# Patient Record
Sex: Male | Born: 1955 | Race: White | Hispanic: No | Marital: Married | Smoking: Former smoker
Health system: Southern US, Community
[De-identification: ages and names within clinical notes are randomized; demographics above are authoritative.]

## PROBLEM LIST (undated history)

## (undated) DIAGNOSIS — Z87891 Personal history of nicotine dependence: Secondary | ICD-10-CM

## (undated) DIAGNOSIS — F419 Anxiety disorder, unspecified: Secondary | ICD-10-CM

## (undated) DIAGNOSIS — E119 Type 2 diabetes mellitus without complications: Secondary | ICD-10-CM

## (undated) DIAGNOSIS — I10 Essential (primary) hypertension: Secondary | ICD-10-CM

## (undated) DIAGNOSIS — D751 Secondary polycythemia: Secondary | ICD-10-CM

## (undated) DIAGNOSIS — D72829 Elevated white blood cell count, unspecified: Secondary | ICD-10-CM

## (undated) DIAGNOSIS — I2510 Atherosclerotic heart disease of native coronary artery without angina pectoris: Secondary | ICD-10-CM

## (undated) DIAGNOSIS — E785 Hyperlipidemia, unspecified: Secondary | ICD-10-CM

## (undated) HISTORY — PX: OTHER SURGICAL HISTORY: SHX169

## (undated) HISTORY — DX: Anxiety disorder, unspecified: F41.9

## (undated) HISTORY — DX: Secondary polycythemia: D75.1

## (undated) HISTORY — DX: Personal history of nicotine dependence: Z87.891

---

## 1998-02-16 ENCOUNTER — Emergency Department (HOSPITAL_COMMUNITY): Admission: EM | Admit: 1998-02-16 | Discharge: 1998-02-16 | Payer: Self-pay | Admitting: Endocrinology

## 2001-06-30 ENCOUNTER — Encounter: Payer: Self-pay | Admitting: Emergency Medicine

## 2001-06-30 ENCOUNTER — Emergency Department (HOSPITAL_COMMUNITY): Admission: EM | Admit: 2001-06-30 | Discharge: 2001-06-30 | Payer: Self-pay | Admitting: *Deleted

## 2001-07-09 ENCOUNTER — Encounter: Payer: Self-pay | Admitting: Orthopedic Surgery

## 2001-07-09 ENCOUNTER — Encounter: Admission: RE | Admit: 2001-07-09 | Discharge: 2001-07-09 | Payer: Self-pay | Admitting: Orthopedic Surgery

## 2007-10-07 ENCOUNTER — Encounter: Admission: RE | Admit: 2007-10-07 | Discharge: 2007-10-07 | Payer: Self-pay | Admitting: Orthopaedic Surgery

## 2012-06-05 ENCOUNTER — Emergency Department (HOSPITAL_COMMUNITY)
Admission: EM | Admit: 2012-06-05 | Discharge: 2012-06-05 | Disposition: A | Discharge status: Home / Self Care | Payer: PRIVATE HEALTH INSURANCE | Attending: Emergency Medicine | Admitting: Emergency Medicine

## 2012-06-05 ENCOUNTER — Encounter (HOSPITAL_COMMUNITY): Payer: Self-pay | Admitting: *Deleted

## 2012-06-05 DIAGNOSIS — F17200 Nicotine dependence, unspecified, uncomplicated: Secondary | ICD-10-CM | POA: Insufficient documentation

## 2012-06-05 DIAGNOSIS — N200 Calculus of kidney: Secondary | ICD-10-CM | POA: Insufficient documentation

## 2012-06-05 LAB — CBC WITH DIFFERENTIAL/PLATELET
Basophils Absolute: 0 10*3/uL (ref 0.0–0.1)
Basophils Relative: 0 % (ref 0–1)
Eosinophils Absolute: 0.2 10*3/uL (ref 0.0–0.7)
Hemoglobin: 17.7 g/dL — ABNORMAL HIGH (ref 13.0–17.0)
MCHC: 35 g/dL (ref 30.0–36.0)
Neutro Abs: 8.3 10*3/uL — ABNORMAL HIGH (ref 1.7–7.7)
Neutrophils Relative %: 61 % (ref 43–77)
Platelets: 317 10*3/uL (ref 150–400)
RDW: 12.9 % (ref 11.5–15.5)

## 2012-06-05 LAB — COMPREHENSIVE METABOLIC PANEL
AST: 17 U/L (ref 0–37)
Albumin: 4.3 g/dL (ref 3.5–5.2)
Alkaline Phosphatase: 68 U/L (ref 39–117)
Chloride: 98 mEq/L (ref 96–112)
Potassium: 5.7 mEq/L — ABNORMAL HIGH (ref 3.5–5.1)
Sodium: 134 mEq/L — ABNORMAL LOW (ref 135–145)
Total Bilirubin: 0.3 mg/dL (ref 0.3–1.2)
Total Protein: 7.8 g/dL (ref 6.0–8.3)

## 2012-06-05 LAB — URINALYSIS, ROUTINE W REFLEX MICROSCOPIC
Bilirubin Urine: NEGATIVE
Glucose, UA: 500 mg/dL — AB
Ketones, ur: 15 mg/dL — AB
Leukocytes, UA: NEGATIVE
Nitrite: NEGATIVE
Specific Gravity, Urine: 1.013 (ref 1.005–1.030)
pH: 6.5 (ref 5.0–8.0)

## 2012-06-05 LAB — URINE MICROSCOPIC-ADD ON

## 2012-06-05 NOTE — ED Provider Notes (Signed)
History     CSN: [id number]  Arrival date & time 06/05/12  7253   First MD Initiated Contact with Patient 06/05/12 2208      Chief Complaint  Patient presents with  . Abdominal Pain    (Consider location/radiation/quality/duration/timing/severity/associated sxs/prior treatment) Patient is a 56 y.o. male presenting with abdominal pain. The history is provided by the patient.  Abdominal Pain The primary symptoms of the illness include abdominal pain and nausea. The primary symptoms of the illness do not include fever, shortness of breath, vomiting, diarrhea or dysuria. The current episode started 3 to 5 hours ago. The onset of the illness was sudden. The problem has been resolved.  The pain came on suddenly. The abdominal pain is located in the LLQ. The abdominal pain does not radiate. The severity of the abdominal pain is 10/10. The abdominal pain is relieved by nothing. Exacerbated by: nothing.  The patient has not had a change in bowel habit. Symptoms associated with the illness do not include anorexia, urgency, frequency or back pain. Associated medical issues comments: none.    History reviewed. No pertinent past medical history.  History reviewed. No pertinent past surgical history.  No family history on file.  History  Substance Use Topics  . Smoking status: Current Every Day Smoker  . Smokeless tobacco: Not on file  . Alcohol Use: No      Review of Systems  Constitutional: Negative for fever.  Respiratory: Negative for shortness of breath.   Gastrointestinal: Positive for nausea and abdominal pain. Negative for vomiting, diarrhea and anorexia.  Genitourinary: Negative for dysuria, urgency and frequency.  Musculoskeletal: Negative for back pain.  All other systems reviewed and are negative.    Allergies  Review of patient's allergies indicates no known allergies.  Home Medications   Current Outpatient Rx  Name Route Sig Dispense Refill  . ALPRAZOLAM 0.5 MG  PO TABS Oral Take 0.5 mg by mouth 4 (four) times daily as needed. For panic attacks    . LORATADINE 10 MG PO TABS Oral Take 10 mg by mouth daily.    Marland Kitchen NAPROXEN SODIUM 220 MG PO TABS Oral Take 220 mg by mouth daily.      BP 158/85  Pulse 92  Temp 97.6 F (36.4 C) (Oral)  Resp 20  SpO2 99%  Physical Exam  Nursing note and vitals reviewed. Constitutional: He is oriented to person, place, and time. He appears well-developed and well-nourished. No distress.  HENT:  Head: Normocephalic and atraumatic.  Mouth/Throat: Oropharynx is clear and moist.  Eyes: Conjunctivae normal and EOM are normal. Pupils are equal, round, and reactive to light.  Neck: Normal range of motion. Neck supple.  Cardiovascular: Normal rate, regular rhythm and intact distal pulses.   No murmur heard. Pulmonary/Chest: Effort normal and breath sounds normal. No respiratory distress. He has no wheezes. He has no rales.  Abdominal: Soft. He exhibits no distension. There is no tenderness. There is no rebound, no guarding and no CVA tenderness.  Musculoskeletal: Normal range of motion. He exhibits no edema and no tenderness.  Neurological: He is alert and oriented to person, place, and time.  Skin: Skin is warm and dry. No rash noted. No erythema.  Psychiatric: He has a normal mood and affect. His behavior is normal.    ED Course  Procedures (including critical care time)  Labs Reviewed  CBC WITH DIFFERENTIAL - Abnormal; Notable for the following:    WBC 13.5 (*)     Hemoglobin 17.7 (*)  Neutro Abs 8.3 (*)     All other components within normal limits  COMPREHENSIVE METABOLIC PANEL - Abnormal; Notable for the following:    Sodium 134 (*)     Potassium 5.7 (*)     Glucose, Bld 233 (*)     All other components within normal limits  URINALYSIS, ROUTINE W REFLEX MICROSCOPIC - Abnormal; Notable for the following:    Glucose, UA 500 (*)     Hgb urine dipstick LARGE (*)     Ketones, ur 15 (*)     All other  components within normal limits  LIPASE, BLOOD  URINE MICROSCOPIC-ADD ON   No results found.   1. Kidney stone       MDM   Pt with symptoms consistent with kidney stone.  Denies infectious sx, or GI symptoms.  Low concern for diverticulitis and no risk factors or history suggestive of AAA.  No hx suggestive of GU source (discharge) and otherwise pt is healthy.  Will  ensure no infection with UA, CBC, BMP and are wnl except for blood in urine consistent with stone.  When I saw the pt he was pain free.   Pt also has elevated blood sugar today and glucose in urine.  Will f/u with PCP for formal evaluation.       Gwyneth Sprout, MD 06/05/12 [phone]

## 2012-06-05 NOTE — ED Notes (Signed)
Pt states around 430 this afternoon he began having lower abdominal pain with diaphoresis.  No pain at this time.  States pain lasted for approx 30 minutes.  LBM yesterday.  Alert and oriented with family at bedside.

## 2012-06-05 NOTE — ED Notes (Signed)
No rx given, pt voiced understanding to f/u with PCP and return for worsening of condition. 

## 2012-06-05 NOTE — ED Notes (Signed)
The pt had a sudden onset of lt lower abd  approx one hour ago with sweating

## 2012-06-09 ENCOUNTER — Emergency Department (HOSPITAL_COMMUNITY): Payer: PRIVATE HEALTH INSURANCE

## 2012-06-09 ENCOUNTER — Emergency Department (HOSPITAL_COMMUNITY)
Admission: EM | Admit: 2012-06-09 | Discharge: 2012-06-09 | Disposition: A | Discharge status: Home / Self Care | Payer: PRIVATE HEALTH INSURANCE | Attending: Emergency Medicine | Admitting: Emergency Medicine

## 2012-06-09 DIAGNOSIS — N201 Calculus of ureter: Secondary | ICD-10-CM | POA: Insufficient documentation

## 2012-06-09 DIAGNOSIS — N200 Calculus of kidney: Secondary | ICD-10-CM

## 2012-06-09 DIAGNOSIS — R109 Unspecified abdominal pain: Secondary | ICD-10-CM | POA: Insufficient documentation

## 2012-06-09 LAB — CBC WITH DIFFERENTIAL/PLATELET
Basophils Absolute: 0 10*3/uL (ref 0.0–0.1)
Basophils Relative: 0 % (ref 0–1)
Eosinophils Absolute: 0.1 10*3/uL (ref 0.0–0.7)
Hemoglobin: 16.7 g/dL (ref 13.0–17.0)
MCH: 33.6 pg (ref 26.0–34.0)
MCHC: 36 g/dL (ref 30.0–36.0)
Monocytes Relative: 7 % (ref 3–12)
Neutro Abs: 13.2 10*3/uL — ABNORMAL HIGH (ref 1.7–7.7)
Neutrophils Relative %: 79 % — ABNORMAL HIGH (ref 43–77)
Platelets: 291 10*3/uL (ref 150–400)

## 2012-06-09 LAB — COMPREHENSIVE METABOLIC PANEL
ALT: 12 U/L (ref 0–53)
AST: 16 U/L (ref 0–37)
Albumin: 3.9 g/dL (ref 3.5–5.2)
Alkaline Phosphatase: 63 U/L (ref 39–117)
Chloride: 101 mEq/L (ref 96–112)
Potassium: 4.3 mEq/L (ref 3.5–5.1)
Sodium: 136 mEq/L (ref 135–145)
Total Bilirubin: 0.4 mg/dL (ref 0.3–1.2)
Total Protein: 7.3 g/dL (ref 6.0–8.3)

## 2012-06-09 LAB — URINALYSIS, ROUTINE W REFLEX MICROSCOPIC
Bilirubin Urine: NEGATIVE
Glucose, UA: >1000 mg/dL — AB
Ketones, ur: 15 mg/dL — AB
Leukocytes, UA: NEGATIVE
Nitrite: NEGATIVE
Protein, ur: NEGATIVE mg/dL
Specific Gravity, Urine: 1.031 — ABNORMAL HIGH (ref 1.005–1.030)
Urobilinogen, UA: 1 mg/dL (ref 0.0–1.0)
pH: 7 (ref 5.0–8.0)

## 2012-06-09 LAB — URINE MICROSCOPIC-ADD ON

## 2012-06-09 MED ORDER — HYDROCODONE-ACETAMINOPHEN 5-325 MG PO TABS: 1.0000 | ORAL_TABLET | ORAL | Status: DC | PRN

## 2012-06-09 MED ORDER — MORPHINE SULFATE 4 MG/ML IJ SOLN: 6.0000 mg | Freq: Once | INTRAMUSCULAR | Status: DC

## 2012-06-09 MED ORDER — MORPHINE SULFATE 4 MG/ML IJ SOLN
6.0000 mg | Freq: Once | INTRAMUSCULAR | Status: AC
  Administered 2012-06-09: 6 mg via INTRAMUSCULAR
  Filled 2012-06-09: qty 2

## 2012-06-09 MED ORDER — SODIUM CHLORIDE 0.9 % IV BOLUS (SEPSIS): 1000.0000 mL | Freq: Once | INTRAVENOUS | Status: DC

## 2012-06-09 NOTE — ED Notes (Signed)
States was up here on Friday night w/ same pain and dx w/ kidney stone pt states pain is worse and his back is hurting bad also

## 2012-06-09 NOTE — ED Notes (Signed)
Pt requesting to receive IM medication and does not wish to have an IV, MD Arrieta notified and states orders may be adjusted.

## 2012-06-09 NOTE — ED Provider Notes (Signed)
History     CSN: [id number]  Arrival date & time 06/09/12  1430   First MD Initiated Contact with Patient 06/09/12 1734      No chief complaint on file.  HPI  56 year-old male who was seen here couple days ago for left flank pain and clinically diagnosed with kidney stones presents with non-resolved left flank pain. He denies any fevers, emesis, chest pain, shortness of breath, constipation, diarrhea. He does endorse dysuria, hematuria. He he reports that 2 days ago while urinating he saw multiple small specks which he believes were kidney stones. Unfortunately for Mr. Geffrard the pain continued. Pain is described as a sharp intermittent pain. In addition to a constant left flank discomfort. He has not noted any alleviating or aggravating factors.  No past medical history on file.  No past surgical history on file.  No family history on file.  History  Substance Use Topics  . Smoking status: Current Every Day Smoker  . Smokeless tobacco: Not on file  . Alcohol Use: No      Review of Systems  Constitutional: Negative for fever, activity change and appetite change.  HENT: Negative for ear pain, congestion, sore throat, rhinorrhea, neck pain, neck stiffness and sinus pressure.   Eyes: Negative for pain and redness.  Respiratory: Negative for cough, chest tightness and shortness of breath.   Cardiovascular: Negative for chest pain and palpitations.  Gastrointestinal: Negative for nausea, vomiting, abdominal pain, diarrhea and abdominal distention.  Genitourinary: Positive for dysuria, hematuria and flank pain. Negative for discharge, difficulty urinating, penile pain and testicular pain.  Musculoskeletal: Negative for back pain.  Skin: Negative for rash and wound.  Neurological: Negative for dizziness, light-headedness, numbness and headaches.  Hematological: Negative for adenopathy.  Psychiatric/Behavioral: Negative for behavioral problems, confusion and agitation.     Allergies  Review of patient's allergies indicates no known allergies.  Home Medications   Current Outpatient Rx  Name Route Sig Dispense Refill  . ALPRAZOLAM 0.5 MG PO TABS Oral Take 0.5 mg by mouth 4 (four) times daily as needed. For panic attacks    . LORATADINE 10 MG PO TABS Oral Take 10 mg by mouth daily.    Marland Kitchen NAPROXEN SODIUM 220 MG PO TABS Oral Take 220 mg by mouth daily.      BP 167/92  Pulse 90  Temp 98.1 F (36.7 C) (Oral)  Resp 20  SpO2 97%  Physical Exam  Constitutional: He is oriented to person, place, and time. He appears well-developed and well-nourished. No distress.  HENT:  Head: Normocephalic and atraumatic.  Nose: Nose normal.  Mouth/Throat: Oropharynx is clear and moist.  Eyes: Conjunctivae normal and EOM are normal. Pupils are equal, round, and reactive to light.  Neck: Normal range of motion. Neck supple. No tracheal deviation present.  Cardiovascular: Normal rate, regular rhythm, normal heart sounds and intact distal pulses.   Pulmonary/Chest: Effort normal and breath sounds normal. No respiratory distress. He has no rales.  Abdominal: Soft. Bowel sounds are normal. He exhibits no distension. There is no tenderness. There is no rebound and no guarding.       Left CVA tenderness  Musculoskeletal: Normal range of motion. He exhibits no edema and no tenderness.  Neurological: He is alert and oriented to person, place, and time.  Skin: Skin is warm and dry.  Psychiatric: He has a normal mood and affect. His behavior is normal.    ED Course  Procedures (including critical care time)  Results for  orders placed during the hospital encounter of 06/09/12  URINALYSIS, ROUTINE W REFLEX MICROSCOPIC      Component Value Range   Color, Urine YELLOW  YELLOW   APPearance CLEAR  CLEAR   Specific Gravity, Urine 1.031 (*) 1.005 - 1.030   pH 7.0  5.0 - 8.0   Glucose, UA >1000 (*) NEGATIVE mg/dL   Hgb urine dipstick SMALL (*) NEGATIVE   Bilirubin Urine NEGATIVE   NEGATIVE   Ketones, ur 15 (*) NEGATIVE mg/dL   Protein, ur NEGATIVE  NEGATIVE mg/dL   Urobilinogen, UA 1.0  0.0 - 1.0 mg/dL   Nitrite NEGATIVE  NEGATIVE   Leukocytes, UA NEGATIVE  NEGATIVE  CBC WITH DIFFERENTIAL      Component Value Range   WBC 16.7 (*) 4.0 - 10.5 K/uL   RBC 4.97  4.22 - 5.81 MIL/uL   Hemoglobin 16.7  13.0 - 17.0 g/dL   HCT 16.1  09.6 - 04.5 %   MCV 93.4  78.0 - 100.0 fL   MCH 33.6  26.0 - 34.0 pg   MCHC 36.0  30.0 - 36.0 g/dL   RDW 40.9  81.1 - 91.4 %   Platelets 291  150 - 400 K/uL   Neutrophils Relative 79 (*) 43 - 77 %   Neutro Abs 13.2 (*) 1.7 - 7.7 K/uL   Lymphocytes Relative 14  12 - 46 %   Lymphs Abs 2.3  0.7 - 4.0 K/uL   Monocytes Relative 7  3 - 12 %   Monocytes Absolute 1.2 (*) 0.1 - 1.0 K/uL   Eosinophils Relative 0  0 - 5 %   Eosinophils Absolute 0.1  0.0 - 0.7 K/uL   Basophils Relative 0  0 - 1 %   Basophils Absolute 0.0  0.0 - 0.1 K/uL  COMPREHENSIVE METABOLIC PANEL      Component Value Range   Sodium 136  135 - 145 mEq/L   Potassium 4.3  3.5 - 5.1 mEq/L   Chloride 101  96 - 112 mEq/L   CO2 24  19 - 32 mEq/L   Glucose, Bld 265 (*) 70 - 99 mg/dL   BUN 16  6 - 23 mg/dL   Creatinine, Ser 7.82  0.50 - 1.35 mg/dL   Calcium 9.4  8.4 - 95.6 mg/dL   Total Protein 7.3  6.0 - 8.3 g/dL   Albumin 3.9  3.5 - 5.2 g/dL   AST 16  0 - 37 U/L   ALT 12  0 - 53 U/L   Alkaline Phosphatase 63  39 - 117 U/L   Total Bilirubin 0.4  0.3 - 1.2 mg/dL   GFR calc non Af Amer 88 (*) >90 mL/min   GFR calc Af Amer >90  >90 mL/min  URINE MICROSCOPIC-ADD ON      Component Value Range   WBC, UA 0-2  <3 WBC/hpf   RBC / HPF 11-20  <3 RBC/hpf   Bacteria, UA RARE  RARE      CT Abdomen Pelvis Wo Contrast (Final result)   Result time:06/09/12 1807    Final result by Rad Results In Interface (06/09/12 18:07:19)    Narrative:   *RADIOLOGY REPORT*  Clinical Data: Left flank pain. Hematuria.  CT ABDOMEN AND PELVIS WITHOUT CONTRAST  Technique: Multidetector CT imaging  of the abdomen and pelvis was performed following the standard protocol without intravenous contrast.  Comparison: None.  Findings: Interstitial accentuation and emphysema is suspected in the lower lobes.  A borderline prominent gastrohepatic  ligament node measures 1.0 cm in short axis, image 18 of series 2.  The visualized portion of the liver, spleen, pancreas, and adrenal glands appear unremarkable in noncontrast CT appearance.  The gallbladder and biliary system appear unremarkable.  Mild left hydronephrosis is present related to a 6 mm left proximal ureteral calculus the L3-4 intervertebral level. Left ureter distal to this calculus is of normal caliber, and no other renal or ureteral calculi are observed.  Aortoiliac atherosclerotic calcification noted. Appendix normal. No dilated bowel. Urinary bladder normal.  IMPRESSION:  1. 6 mm obstructive proximal ureteral calculus at the L3-4 intervertebral level. No other calculi noted. Mild left hydronephrosis. 2. Suspected mild emphysema.   Original Report Authenticated By: Dellia Cloud, M.D.      1. Renal stone       MDM  56 year old male presents with left renal stone. No evidence of infection. UA negative for nitrites or leukocytes or bacteria. Patient denies nausea or vomiting. CT was 6 mm obstructive proximal ureteral calculus with mild hydronephrosis. Thorough discussion with the patient on findings, return precautions, and plan. Will call urology tomorrow morning. Patient's pain was controlled with one dose of morphine.   New Prescriptions   HYDROCODONE-ACETAMINOPHEN (NORCO/VICODIN) 5-325 MG PER TABLET    Take 1 tablet by mouth every 4 (four) hours as needed for pain.          Nadara Mustard, MD 06/10/12 [phone]

## 2012-06-09 NOTE — ED Notes (Signed)
MD at bedside to assess patient.

## 2012-06-14 NOTE — ED Provider Notes (Signed)
I saw and evaluated the patient, reviewed the resident's note and I agree with the findings and plan.  Toy Baker, MD 06/14/12 1018

## 2012-10-29 ENCOUNTER — Inpatient Hospital Stay (HOSPITAL_COMMUNITY)
Admission: EM | Admit: 2012-10-29 | Discharge: 2012-10-31 | DRG: 249 | Disposition: A | Discharge status: Home / Self Care | Payer: Commercial Managed Care - PPO | Attending: Emergency Medicine | Admitting: Emergency Medicine

## 2012-10-29 ENCOUNTER — Encounter (HOSPITAL_COMMUNITY): Payer: Self-pay | Admitting: *Deleted

## 2012-10-29 ENCOUNTER — Ambulatory Visit (HOSPITAL_COMMUNITY): Admit: 2012-10-29 | Payer: Self-pay | Admitting: Cardiovascular Disease

## 2012-10-29 ENCOUNTER — Encounter (HOSPITAL_COMMUNITY): Admission: EM | Disposition: A | Discharge status: Home / Self Care | Payer: Self-pay | Source: Home / Self Care

## 2012-10-29 ENCOUNTER — Inpatient Hospital Stay (HOSPITAL_COMMUNITY): Payer: Commercial Managed Care - PPO

## 2012-10-29 DIAGNOSIS — D72829 Elevated white blood cell count, unspecified: Secondary | ICD-10-CM | POA: Diagnosis present

## 2012-10-29 DIAGNOSIS — Z79899 Other long term (current) drug therapy: Secondary | ICD-10-CM

## 2012-10-29 DIAGNOSIS — F410 Panic disorder [episodic paroxysmal anxiety] without agoraphobia: Secondary | ICD-10-CM | POA: Diagnosis present

## 2012-10-29 DIAGNOSIS — IMO0001 Reserved for inherently not codable concepts without codable children: Secondary | ICD-10-CM | POA: Diagnosis present

## 2012-10-29 DIAGNOSIS — I2582 Chronic total occlusion of coronary artery: Secondary | ICD-10-CM | POA: Diagnosis present

## 2012-10-29 DIAGNOSIS — Z720 Tobacco use: Secondary | ICD-10-CM | POA: Diagnosis present

## 2012-10-29 DIAGNOSIS — I2510 Atherosclerotic heart disease of native coronary artery without angina pectoris: Secondary | ICD-10-CM | POA: Diagnosis present

## 2012-10-29 DIAGNOSIS — E119 Type 2 diabetes mellitus without complications: Secondary | ICD-10-CM | POA: Diagnosis present

## 2012-10-29 DIAGNOSIS — Z955 Presence of coronary angioplasty implant and graft: Secondary | ICD-10-CM

## 2012-10-29 DIAGNOSIS — I2119 ST elevation (STEMI) myocardial infarction involving other coronary artery of inferior wall: Principal | ICD-10-CM | POA: Diagnosis present

## 2012-10-29 DIAGNOSIS — I10 Essential (primary) hypertension: Secondary | ICD-10-CM | POA: Diagnosis present

## 2012-10-29 DIAGNOSIS — Z8249 Family history of ischemic heart disease and other diseases of the circulatory system: Secondary | ICD-10-CM

## 2012-10-29 DIAGNOSIS — E785 Hyperlipidemia, unspecified: Secondary | ICD-10-CM | POA: Diagnosis present

## 2012-10-29 DIAGNOSIS — F17200 Nicotine dependence, unspecified, uncomplicated: Secondary | ICD-10-CM | POA: Diagnosis present

## 2012-10-29 DIAGNOSIS — I213 ST elevation (STEMI) myocardial infarction of unspecified site: Secondary | ICD-10-CM

## 2012-10-29 HISTORY — DX: Essential (primary) hypertension: I10

## 2012-10-29 HISTORY — DX: Atherosclerotic heart disease of native coronary artery without angina pectoris: I25.10

## 2012-10-29 HISTORY — DX: Hyperlipidemia, unspecified: E78.5

## 2012-10-29 HISTORY — DX: Type 2 diabetes mellitus without complications: E11.9

## 2012-10-29 HISTORY — PX: LEFT HEART CATHETERIZATION WITH CORONARY ANGIOGRAM: SHX5451

## 2012-10-29 HISTORY — DX: Elevated white blood cell count, unspecified: D72.829

## 2012-10-29 LAB — POCT I-STAT, CHEM 8
BUN: 10 mg/dL (ref 6–23)
Chloride: 103 mEq/L (ref 96–112)
Creatinine, Ser: 0.9 mg/dL (ref 0.50–1.35)
Glucose, Bld: 266 mg/dL — ABNORMAL HIGH (ref 70–99)
Potassium: 3.3 mEq/L — ABNORMAL LOW (ref 3.5–5.1)
Sodium: 139 mEq/L (ref 135–145)

## 2012-10-29 LAB — CBC
Hemoglobin: 18.3 g/dL — ABNORMAL HIGH (ref 13.0–17.0)
MCH: 33.2 pg (ref 26.0–34.0)
MCV: 89.7 fL (ref 78.0–100.0)
Platelets: 287 10*3/uL (ref 150–400)
RBC: 5.52 MIL/uL (ref 4.22–5.81)
WBC: 12.7 10*3/uL — ABNORMAL HIGH (ref 4.0–10.5)

## 2012-10-29 LAB — BASIC METABOLIC PANEL
CO2: 21 mEq/L (ref 19–32)
Chloride: 97 mEq/L (ref 96–112)
Creatinine, Ser: 0.87 mg/dL (ref 0.50–1.35)
Glucose, Bld: 262 mg/dL — ABNORMAL HIGH (ref 70–99)

## 2012-10-29 LAB — HEMOGLOBIN A1C: Mean Plasma Glucose: 260 mg/dL — ABNORMAL HIGH (ref <117)

## 2012-10-29 LAB — MRSA PCR SCREENING: MRSA by PCR: NEGATIVE

## 2012-10-29 LAB — GLUCOSE, CAPILLARY
Glucose-Capillary: 258 mg/dL — ABNORMAL HIGH (ref 70–99)
Glucose-Capillary: 197 mg/dL — ABNORMAL HIGH (ref 70–99)

## 2012-10-29 LAB — POCT I-STAT TROPONIN I

## 2012-10-29 LAB — CK TOTAL AND CKMB (NOT AT ARMC): Relative Index: INVALID (ref 0.0–2.5)

## 2012-10-29 LAB — TROPONIN I
Troponin I: <0.3 ng/mL (ref <0.30)
Troponin I: >20 ng/mL (ref <0.30)
Troponin I: >20 ng/mL (ref <0.30)

## 2012-10-29 LAB — CREATININE, SERUM: GFR calc Af Amer: >90 mL/min (ref >90)

## 2012-10-29 SURGERY — LEFT HEART CATHETERIZATION WITH CORONARY ANGIOGRAM
Anesthesia: LOCAL

## 2012-10-29 MED ORDER — ASPIRIN 81 MG PO CHEW
CHEWABLE_TABLET | ORAL | Status: AC
  Filled 2012-10-29: qty 4

## 2012-10-29 MED ORDER — VERAPAMIL HCL 2.5 MG/ML IV SOLN
INTRAVENOUS | Status: AC
  Filled 2012-10-29: qty 2

## 2012-10-29 MED ORDER — LISINOPRIL 5 MG PO TABS
5.0000 mg | ORAL_TABLET | Freq: Every day | ORAL | Status: DC
  Administered 2012-10-29 – 2012-10-31 (×3): 5 mg via ORAL
  Filled 2012-10-29 (×3): qty 1

## 2012-10-29 MED ORDER — ALPRAZOLAM 0.25 MG PO TABS: 0.2500 mg | ORAL_TABLET | Freq: Two times a day (BID) | ORAL | Status: DC | PRN

## 2012-10-29 MED ORDER — PRASUGREL HCL 10 MG PO TABS
10.0000 mg | ORAL_TABLET | Freq: Every day | ORAL | Status: DC
  Administered 2012-10-31: 10 mg via ORAL
  Filled 2012-10-29 (×2): qty 1

## 2012-10-29 MED ORDER — ENOXAPARIN SODIUM 40 MG/0.4ML ~~LOC~~ SOLN
40.0000 mg | SUBCUTANEOUS | Status: DC
  Administered 2012-10-29 – 2012-10-30 (×2): 40 mg via SUBCUTANEOUS
  Filled 2012-10-29 (×3): qty 0.4

## 2012-10-29 MED ORDER — HEPARIN (PORCINE) IN NACL 2-0.9 UNIT/ML-% IJ SOLN
INTRAMUSCULAR | Status: AC
  Filled 2012-10-29: qty 1000

## 2012-10-29 MED ORDER — PRASUGREL HCL 10 MG PO TABS
ORAL_TABLET | ORAL | Status: AC
  Administered 2012-10-30: 10 mg via ORAL
  Filled 2012-10-29: qty 6

## 2012-10-29 MED ORDER — ONDANSETRON HCL 4 MG/2ML IJ SOLN: 4.0000 mg | Freq: Four times a day (QID) | INTRAMUSCULAR | Status: DC | PRN

## 2012-10-29 MED ORDER — BIVALIRUDIN 250 MG IV SOLR
INTRAVENOUS | Status: AC
  Filled 2012-10-29: qty 250

## 2012-10-29 MED ORDER — OXYCODONE-ACETAMINOPHEN 5-325 MG PO TABS
1.0000 | ORAL_TABLET | ORAL | Status: DC | PRN
  Administered 2012-10-30: 2 via ORAL
  Filled 2012-10-29: qty 2

## 2012-10-29 MED ORDER — MIDAZOLAM HCL 2 MG/2ML IJ SOLN
INTRAMUSCULAR | Status: AC
  Filled 2012-10-29: qty 2

## 2012-10-29 MED ORDER — ACETAMINOPHEN 325 MG PO TABS: 650.0000 mg | ORAL_TABLET | ORAL | Status: DC | PRN

## 2012-10-29 MED ORDER — MORPHINE SULFATE 2 MG/ML IJ SOLN: 2.0000 mg | INTRAMUSCULAR | Status: DC | PRN

## 2012-10-29 MED ORDER — FENTANYL CITRATE 0.05 MG/ML IJ SOLN
INTRAMUSCULAR | Status: AC
  Filled 2012-10-29: qty 2

## 2012-10-29 MED ORDER — ASPIRIN 81 MG PO CHEW
81.0000 mg | CHEWABLE_TABLET | Freq: Every day | ORAL | Status: DC
  Administered 2012-10-30 – 2012-10-31 (×2): 81 mg via ORAL
  Filled 2012-10-29 (×2): qty 1

## 2012-10-29 MED ORDER — NITROGLYCERIN 0.4 MG SL SUBL: 0.4000 mg | SUBLINGUAL_TABLET | SUBLINGUAL | Status: DC | PRN

## 2012-10-29 MED ORDER — INSULIN ASPART 100 UNIT/ML ~~LOC~~ SOLN
0.0000 [IU] | Freq: Every day | SUBCUTANEOUS | Status: DC
  Administered 2012-10-30: 2 [IU] via SUBCUTANEOUS

## 2012-10-29 MED ORDER — HYDRALAZINE HCL 20 MG/ML IJ SOLN
10.0000 mg | INTRAMUSCULAR | Status: DC | PRN
  Filled 2012-10-29: qty 0.5

## 2012-10-29 MED ORDER — POTASSIUM CHLORIDE CRYS ER 20 MEQ PO TBCR
40.0000 meq | EXTENDED_RELEASE_TABLET | Freq: Once | ORAL | Status: AC
  Administered 2012-10-29: 40 meq via ORAL

## 2012-10-29 MED ORDER — BIVALIRUDIN BOLUS VIA INFUSION
0.1000 mg/kg | Freq: Once | INTRAVENOUS | Status: DC
  Filled 2012-10-29: qty 10

## 2012-10-29 MED ORDER — ACTIVE PARTNERSHIP FOR HEALTH OF YOUR HEART BOOK
Freq: Once | Status: AC
  Administered 2012-10-29: 18:00:00
  Filled 2012-10-29: qty 1

## 2012-10-29 MED ORDER — SODIUM CHLORIDE 0.9 % IJ SOLN: 3.0000 mL | Freq: Two times a day (BID) | INTRAMUSCULAR | Status: DC

## 2012-10-29 MED ORDER — CARVEDILOL 6.25 MG PO TABS
6.2500 mg | ORAL_TABLET | Freq: Two times a day (BID) | ORAL | Status: DC
  Administered 2012-10-29 – 2012-10-31 (×4): 6.25 mg via ORAL
  Filled 2012-10-29 (×6): qty 1

## 2012-10-29 MED ORDER — HEPARIN SODIUM (PORCINE) 5000 UNIT/ML IJ SOLN
60.0000 [IU]/kg | INTRAMUSCULAR | Status: DC
  Administered 2012-10-29: 4000 [IU] via INTRAVENOUS

## 2012-10-29 MED ORDER — POTASSIUM CHLORIDE CRYS ER 20 MEQ PO TBCR
EXTENDED_RELEASE_TABLET | ORAL | Status: AC
  Filled 2012-10-29: qty 2

## 2012-10-29 MED ORDER — ZOLPIDEM TARTRATE 5 MG PO TABS: 5.0000 mg | ORAL_TABLET | Freq: Every evening | ORAL | Status: DC | PRN

## 2012-10-29 MED ORDER — SODIUM CHLORIDE 0.9 % IV SOLN
0.2500 mg/kg/h | INTRAVENOUS | Status: DC
  Filled 2012-10-29: qty 250

## 2012-10-29 MED ORDER — INSULIN ASPART 100 UNIT/ML ~~LOC~~ SOLN
0.0000 [IU] | Freq: Three times a day (TID) | SUBCUTANEOUS | Status: DC
  Administered 2012-10-29: 8 [IU] via SUBCUTANEOUS
  Administered 2012-10-30: 5 [IU] via SUBCUTANEOUS
  Administered 2012-10-30 (×2): 3 [IU] via SUBCUTANEOUS
  Administered 2012-10-31: 5 [IU] via SUBCUTANEOUS
  Administered 2012-10-31: 3 [IU] via SUBCUTANEOUS

## 2012-10-29 MED ORDER — ASPIRIN 325 MG PO TABS: 325.0000 mg | ORAL_TABLET | ORAL | Status: DC

## 2012-10-29 MED ORDER — SODIUM CHLORIDE 0.9 % IV SOLN: 250.0000 mL | INTRAVENOUS | Status: DC | PRN

## 2012-10-29 MED ORDER — SODIUM CHLORIDE 0.9 % IJ SOLN: 3.0000 mL | INTRAMUSCULAR | Status: DC | PRN

## 2012-10-29 MED ORDER — SODIUM CHLORIDE 0.9 % IV SOLN: INTRAVENOUS | Status: DC

## 2012-10-29 MED ORDER — PNEUMOCOCCAL VAC POLYVALENT 25 MCG/0.5ML IJ INJ
0.5000 mL | INJECTION | INTRAMUSCULAR | Status: AC
  Filled 2012-10-29: qty 0.5

## 2012-10-29 MED ORDER — ALPRAZOLAM 0.5 MG PO TABS
0.5000 mg | ORAL_TABLET | Freq: Four times a day (QID) | ORAL | Status: DC | PRN
  Administered 2012-10-29 – 2012-10-31 (×4): 0.5 mg via ORAL
  Filled 2012-10-29 (×4): qty 1

## 2012-10-29 MED ORDER — ATORVASTATIN CALCIUM 80 MG PO TABS
80.0000 mg | ORAL_TABLET | Freq: Every day | ORAL | Status: DC
  Administered 2012-10-29 – 2012-10-30 (×2): 80 mg via ORAL
  Filled 2012-10-29 (×3): qty 1

## 2012-10-29 MED ORDER — ASPIRIN 81 MG PO CHEW
324.0000 mg | CHEWABLE_TABLET | Freq: Once | ORAL | Status: AC
  Administered 2012-10-29: 324 mg via ORAL

## 2012-10-29 MED ORDER — LIDOCAINE HCL (PF) 1 % IJ SOLN
INTRAMUSCULAR | Status: AC
  Filled 2012-10-29: qty 30

## 2012-10-29 NOTE — ED Notes (Signed)
Pt reports bilateral chest pain started this am after lifting car motor. Reports dizziness after lifting motor. Describes pain as muscular. Friend states he was diaphoretic and pale with nausea.

## 2012-10-29 NOTE — Progress Notes (Signed)
Telephoned Rhonda Barrett, PAC to inform of elevated BP and need for SSI orders. New orders received.

## 2012-10-29 NOTE — ED Provider Notes (Signed)
I saw and evaluated the patient, reviewed the resident's note and I agree with the findings including ECG and plan.  D/w Cards after activated Code STEMI.  CRITICAL CARE Performed by: Hurman Horn   Total critical care time:  Critical care time was exclusive of separately billable procedures and treating other patients.  Critical care was necessary to treat or prevent imminent or life-threatening deterioration.  Critical care was time spent personally by me on the following activities: development of treatment plan with patient and/or surrogate as well as nursing, discussions with consultants, evaluation of patient's response to treatment, examination of patient, obtaining history from patient or surrogate, ordering and performing treatments and interventions, ordering and review of laboratory studies, ordering and review of radiographic studies, pulse oximetry and re-evaluation of patient's condition.  Hurman Horn, MD 10/29/12 2119

## 2012-10-29 NOTE — Care Management Note (Signed)
    Page 1 of 1   10/29/2012     2:34:03 PM   CARE MANAGEMENT NOTE 10/29/2012  Patient:  Daniel Lucas, Daniel Lucas   Account Number:  [id number]  Date Initiated:  10/29/2012  Documentation initiated by:  Junius Creamer  Subjective/Objective Assessment:   adm w mi     Action/Plan:   lives w wife   Anticipated DC Date:     Anticipated DC Plan:        DC Planning Services  CM consult      Choice offered to / List presented to:             Status of service:   Medicare Important Message given?   (If response is "NO", the following Medicare IM given date fields will be blank) Date Medicare IM given:   Date Additional Medicare IM given:    Discharge Disposition:    Per UR Regulation:  Reviewed for med. necessity/level of care/duration of stay  If discussed at Long Length of Stay Meetings, dates discussed:    Comments:  3/6 1433 debbie dowell rn,bsn pt has effient 30day free card and copay assist card.

## 2012-10-29 NOTE — Research (Signed)
STENTYS APPOSITION Informed Consent   Subject Name: Daniel Lucas [facility]  Subject met inclusion and exclusion criteria.  The informed consent form, study requirements and expectations were reviewed with the subject and questions and concerns were addressed prior to the signing of the consent form.  The subject verbalized understanding of the trail requirements.  The subject agreed to participate in the STENTYS/APPOSITION trial and signed the informed consent.  The informed consent was obtained prior to performance of any protocol-specific procedures for the subject.  A copy of the signed informed consent was given to the subject and a copy was placed in the subject's medical record.  Claire Shown 10/29/2012, 11:20AM

## 2012-10-29 NOTE — Progress Notes (Signed)
Daniel Lucas called back and new orders were received for KCL due to a K of 3.3  She was also made aware of the troponin greater than 20

## 2012-10-29 NOTE — Progress Notes (Signed)
Notified by lab of troponin greater than 20. Theodore Demark PAC paged

## 2012-10-29 NOTE — ED Notes (Signed)
Pt came in by POV with bilateral cp and nausea and diaphoresis. States the pain is achy in sensation. 5/10. Started about a half hour ago est time 1030.

## 2012-10-29 NOTE — ED Provider Notes (Signed)
History     CSN: [id number]  Arrival date & time 10/29/12  1052   First MD Initiated Contact with Patient 10/29/12 1105      Chief Complaint  Patient presents with  . Code STEMI    (Consider location/radiation/quality/duration/timing/severity/associated sxs/prior treatment) Patient is a 57 y.o. male presenting with chest pain. The history is provided by the patient. No language interpreter was used.  Chest Pain Chest pain location: entire chest. Pain quality: aching and dull   Pain radiates to:  Does not radiate Pain radiates to the back: no   Pain severity:  Moderate Onset quality:  Sudden Duration:  30 minutes Timing:  Constant Progression:  Unchanged Chronicity:  New Context: at rest   Context: not breathing and not lifting   Context comment:  While working on a car Worsened by:  Nothing tried Ineffective treatments:  None tried Associated symptoms: anxiety, cough, diaphoresis, dizziness and nausea   Associated symptoms: no abdominal pain, no anorexia, no back pain, no fatigue, no fever, no headache, no numbness, no shortness of breath, no syncope, not vomiting and no weakness   Risk factors: diabetes mellitus (borderline), hypertension and smoking     Past Medical History  Diagnosis Date  . Hypertension   . Diabetes mellitus   . Tobacco abuse     Past Surgical History  Procedure Laterality Date  . None           History reviewed. No pertinent family history.  History  Substance Use Topics  . Smoking status: Current Every Day Smoker  . Smokeless tobacco: Not on file  . Alcohol Use: No      Review of Systems  Constitutional: Positive for diaphoresis. Negative for fever, chills, activity change, appetite change and fatigue.  HENT: Negative for congestion, sore throat, facial swelling, rhinorrhea, drooling, neck pain and voice change.   Respiratory: Positive for cough. Negative for shortness of breath and stridor.   Cardiovascular: Positive for chest  pain. Negative for syncope.  Gastrointestinal: Positive for nausea. Negative for vomiting, abdominal pain, diarrhea, abdominal distention and anorexia.  Endocrine: Negative for polydipsia and polyuria.  Genitourinary: Negative for dysuria, urgency, frequency and decreased urine volume.  Musculoskeletal: Negative for back pain and gait problem.  Skin: Negative for color change and wound.  Neurological: Positive for dizziness. Negative for facial asymmetry, weakness, numbness and headaches.  Hematological: Does not bruise/bleed easily.  Psychiatric/Behavioral: Negative for confusion and agitation.    Allergies  Review of patient's allergies indicates no known allergies.  Home Medications   No current outpatient prescriptions on file.  BP 166/95  Pulse 91  Temp(Src) 97.5 F (36.4 C) (Oral)  Resp 11  Ht 6\' 2"  (1.88 m)  Wt 201 lb 11.5 oz (91.5 kg)  BMI 25.89 kg/m2  SpO2 100%  Physical Exam  Constitutional: He is oriented to person, place, and time. He appears well-developed and well-nourished. No distress.  HENT:  Head: Normocephalic and atraumatic.  Mouth/Throat: No oropharyngeal exudate.  Eyes: Pupils are equal, round, and reactive to light.  Neck: Normal range of motion. Neck supple.  Cardiovascular: Normal rate, regular rhythm and normal heart sounds.  Exam reveals no gallop and no friction rub.   No murmur heard. Pulmonary/Chest: Effort normal and breath sounds normal. No respiratory distress. He has no wheezes. He has no rales.  Abdominal: Soft. Bowel sounds are normal. He exhibits no distension and no mass. There is no tenderness. There is no rebound and no guarding.  Musculoskeletal: Normal range  of motion. He exhibits no edema and no tenderness.  Neurological: He is alert and oriented to person, place, and time.  Skin: Skin is warm and dry.  Psychiatric: He has a normal mood and affect.    ED Course  Procedures (including critical care time)  Labs Reviewed  CBC -  Abnormal; Notable for the following:    WBC 12.7 (*)    Hemoglobin 18.3 (*)    MCHC 37.0 (*)    All other components within normal limits  BASIC METABOLIC PANEL - Abnormal; Notable for the following:    Potassium 3.3 (*)    Glucose, Bld 262 (*)    All other components within normal limits  APTT - Abnormal; Notable for the following:    aPTT 82 (*)    All other components within normal limits  PROTIME-INR - Abnormal; Notable for the following:    Prothrombin Time 18.1 (*)    INR 1.55 (*)    All other components within normal limits  POCT I-STAT, CHEM 8 - Abnormal; Notable for the following:    Potassium 3.3 (*)    Glucose, Bld 266 (*)    Calcium, Ion 1.10 (*)    Hemoglobin 19.0 (*)    HCT 56.0 (*)    All other components within normal limits  MRSA PCR SCREENING  CK TOTAL AND CKMB  TROPONIN I  TROPONIN I  TROPONIN I  TROPONIN I  TSH  HEMOGLOBIN A1C  CREATININE, SERUM  POCT I-STAT TROPONIN I  POCT ACTIVATED CLOTTING TIME   Portable Chest X-ray 1 View  10/29/2012  *RADIOLOGY REPORT*  Clinical Data: Post cardiac cath.  Myocardial infarction  PORTABLE CHEST - 1 VIEW  Comparison: None.  Findings: Negative for heart failure or effusion.  Lungs are clear. Heart size is within normal limits.  IMPRESSION: Negative   Original Report Authenticated By: Janeece Riggers, M.D.      1. STEMI (ST elevation myocardial infarction)   2. Hypertension   3. Diabetes mellitus   4. Tobacco abuse   5. ST elevation myocardial infarction (STEMI) of inferior wall, initial episode of care      Date: 10/29/2012  Rate: 82  Rhythm: sinus a/ sinur arrythmia  QRS Axis: normal  Intervals: normal  ST/T Wave abnormalities: ST elevations inferiorly, ST depressions anteriorly and ST depressions laterally  Conduction Disutrbances:none  Narrative Interpretation: STEMI  Old EKG Reviewed: none available    MDM  Pt is a 57 y.o. male with pertinent PMHX of HTN, borderline DM, tobacco abuse who presents with  STEMI.  Pt began having pain about 30 mins prior while working on a car.  Pain described as dull, ache, currently 4/10.  Located across chest w/o further radiation.  He had N, diaphoresis earlier which has resolved.  Denies SOB, back pain, leg swelling.  He has had cold-like symptoms for 2-3 days w/ inc cough, but no fever.  EKG shows STE in inferior leads with reciprocal changes.  Pt is hypertensive, no respiratory distress. Pt given 325 ASA and 4K heparin bolus.  Cardiology consulted and pt will be taken to the cath lab.     1. STEMI (ST elevation myocardial infarction)   2. Hypertension   3. Diabetes mellitus   4. Tobacco abuse   5. ST elevation myocardial infarction (STEMI) of inferior wall, initial episode of care      Labs and imaging considered in decision making, reviewed by myself.  Imaging interpreted by radiology. Pt care discussed with my attending,  Dr. Fonnie Jarvis.     Toy Cookey, MD 10/29/12 [phone]

## 2012-10-29 NOTE — CV Procedure (Signed)
  Cardiac Catheterization Procedure Note  Name: Daniel Lucas MRN: [id number] DOB: [birth year]  Procedure: Left Heart Cath, Selective Coronary Angiography, LV angiography,  PTCA/Stent of left circumflex, Perclose of the right femoral artery  Indication: Acute inferolateral wall myocardial infarction   Diagnostic Procedure Details: Initially the right radial area was prepped, draped, and anesthetized percent lidocaine. The right radial pulse was very difficult to palpate. I attempted to gain access but could not ever access the artery. I quickly moved to the femoral side. Using the modified Seldinger technique, a 6 French sheath was placed in the right femoral artery. Standard Judkins catheters were used for selective coronary angiography. It was clear that the patient's culprit vessel was his distal left circumflex which was totally occluded. He was preloaded with effient 60 mg. Angiomax was started for anticoagulation. Once a therapeutic ACT was achieved an XB 3.5 cm guide catheter was decision. The patient had been consented to the Stentys Apposition Trial randomizing him to a self-expanding bare-metal stent versus a traditional balloon expandable bare-metal coronary stent.  A cougar guidewire was advanced beyond the area of total occlusion. The vessel was predilated with a 2.5 x 15 mm balloon. The vessel was initially stented with a 2.5-3.0 x 22 mm stent. The stent moved slightly forward on deployment and while there remained TIMI-3 flow, the proximal edge of the stent showed persistent hypodensity and greater than 50% residual stenosis. I elected to deploy a second stent in overlapping fashion. A 2.5-3.0 x 22 mm stent was chosen and this was carefully deployed so that the lesion was fully covered and there was good overlap with the original stent. The entire stented segment was then postdilated with a 2.75 mm noncompliant balloon to 16 atmospheres throughout. There was an excellent angiographic result  the completion of the procedure with 0% residual stenosis and TIMI-3 flow. A ventriculogram was then performed. The femoral arteriotomy was closed with a Perclose device. There were no immediate procedural complications.  PROCEDURAL FINDINGS Hemodynamics: AO 126/77 LV 129/16  Coronary angiography: Coronary dominance: right  Left mainstem: Patent with no significant stenosis  Left anterior descending (LAD): The LAD is patent. The vessel reaches the left ventricular apex. There is mild diffuse disease and some mild intramyocardial bridging in the mid to distal vessel. The first diagonal has a high origin and has her to 40% segmental plaquing.  Left circumflex (LCx): The left circumflex is initially patent. There is a large caliber intermediate branch. The distal circumflex is totally occluded with the angiographic appearance of an acute occlusion.  Right coronary artery (RCA): The RCA is dominant. It relatively small caliber vessel. There is 70-75% mid RCA stenosis present. The proximal and distal RCA are widely patent. The PDA and PLA branches are patent.  Left ventriculography: There is focal akinesis of the mid inferior wall. The estimated left ventricular ejection fraction is 45-50%.  PCI Data: Vessel - left circumflex/Segment - distal Percent Stenosis (pre)  100 TIMI-flow 0 Stent 2.5-3.0 x 22x2, overlapping Percent Stenosis (post) 0 TIMI-flow (post) 3  Final Conclusions:   1. Acute inferolateral myocardial infarction secondary to total occlusion left circumflex with successful primary PCI as detailed above 2. Moderate mid RCA stenosis 3. Minor nonobstructive LAD stenosis 4. Mild segmental LV dysfunction  Recommendations: Post MI medical therapy, dual antiplatelet therapy with aspirin and effient, tobacco cessation.  Tonny Bollman 10/29/2012, 1:00 PM

## 2012-10-29 NOTE — ED Notes (Signed)
Cardiology at the bedside.

## 2012-10-29 NOTE — H&P (Signed)
History and Physical   Patient ID: Daniel Lucas MRN: [id number], DOB/AGE: Nov 20, 1955 57 y.o. Date of Encounter: 10/29/2012  Primary Physician: Has seen Dr. Manson Passey at an urgent care in [facility] Primary Cardiologist: None  Chief Complaint:  Inferior STEMI  HPI: Daniel Lucas is a 57 y.o. male with no history of CAD. 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 It was a 5/10. He describes it as an aching and tightness. It was associated with some mild shortness of breath which resolved, slight nausea and slight diaphoresis. The symptoms began at approximately 10:30 AM. He had presyncope and dizziness associated with it. Bystanders report he was pale. He has never had symptoms like this before. No medications were given. He was transported to the emergency room. In the emergency room his ECG is consistent with an inferior STEMI. He is having ongoing pain. He is being taken directly to the Cath Lab.  Past Medical History- both hypertension and diabetes recently diagnosed and not on any meds   Diagnosis Date  . Hypertension   . Diabetes mellitus   . Tobacco abuse     Surgical History:  Past Surgical History  Procedure Laterality Date  . None           I have reviewed the patient's current medications. Prior to Admission medications   Medication Sig Start Date End Date Taking? Authorizing Laderrick Wilk  ALPRAZolam Prudy Feeler) 0.5 MG tablet Take 0.5 mg by mouth 4 (four) times daily as needed. For panic attacks   Yes Historical Toniann Dickerson, MD   Scheduled Meds:  Continuous Infusions: . sodium chloride     PRN Meds:.  Allergies: No Known Allergies  History   Social History  . Marital Status: Married    Spouse Name: N/A    Number of Children: N/A  . Years of Education: N/A   Occupational History  . Journalist, newspaper    Social History Main Topics  . Smoking status: Current Every Day Smoker  . Smokeless  tobacco: Not on file  . Alcohol Use: No  . Drug Use:   . Sexually Active:    Other Topics Concern  . Not on file   Social History Narrative   Married, lives with his wife. At least one grandfather had a history of coronary artery disease but no CAD in any first-degree relatives.    Family Status  Relation Status Death Age  . Mother Alive     In her late 6s, no history CAD  . Father Deceased     Automobile accident, no history CAD   Review of Systems:   Full 14-point review of systems otherwise negative except as noted above.  Physical Exam: Blood pressure 177/90, pulse 84, temperature 97.6 F (36.4 C), temperature source Oral, weight 200 lb 9.9 oz (91 kg), SpO2 97.00%. General: Well developed, well nourished, male  in no acute distress. Head: Normocephalic, atraumatic, sclera non-icteric, no xanthomas, nares are without discharge. Dentition: Good Neck: No carotid bruits. JVD not elevated. No thyromegally Lungs: Good expansion bilaterally. without wheezes or rhonchi. Basilar rales. Heart: Regular rate and rhythm with S1 S2.  No S3 or S4.  No murmur, no rubs, or gallops appreciated. Abdomen: Soft, non-tender, non-distended with normoactive bowel sounds. No hepatomegaly. No rebound/guarding. No obvious abdominal masses. Msk:  Strength and tone appear normal for age. No joint deformities or effusions, no spine or costo-vertebral angle tenderness. Extremities:  No clubbing or cyanosis. No edema.  Distal pedal pulses are 2+ in 4 extrem Neuro: Alert and oriented X 3. Moves all extremities spontaneously. No focal deficits noted. Psych:  Responds to questions appropriately with a normal affect. Skin: No rashes or lesions noted  Labs:   Lab Results  Component Value Date   WBC 16.7* 06/09/2012   HGB 19.0* 10/29/2012   HCT 56.0* 10/29/2012   MCV 93.4 06/09/2012   PLT 291 06/09/2012   No results found for this basename: INR,  in the last 72 hours   Recent Labs Lab 10/29/12 1119  NA  139  K 3.3*  CL 103  BUN 10  CREATININE 0.90  GLUCOSE 266*    Recent Labs  10/29/12 1117  TROPIPOC 0.00   Radiology/Studies: Pending  ECG:  Sinus rhythm with inferior ST elevation approximately 2 mm.  ASSESSMENT AND PLAN:  Principal Problem:   ST elevation myocardial infarction (STEMI) of inferior wall, initial episode of care - he is being taken directly to the Cath Lab with further evaluation and treatment depending on the results. He will be started on aspirin, beta blocker, statin and possibly an ACE inhibitor. We will use sliding scale insulin for blood sugar control and consider an internal medicine consult to add medications for his diabetes. He will have a smoking cessation consult and a nutrition consult for his diabetes.  Otherwise, see plan above. Active Problems:   Hypertension    Diabetes mellitus Tobacco abuse     Signed, Maryagnes Amos 10/29/2012, 11:36 AM   Patient seen, examined. Available data reviewed. Agree with findings, assessment, and plan as outlined by Theodore Demark, PA-C. 57 year-old gentleman with DM, HTN, and tobacco presenting with acute inferolateral STEMI. He has ongoing chest pain and ST elevation. Plan proceed with emergency cath and probable PCI. Pt consented to Stentys Apposition Trial. Further plans pending cath results.  Tonny Bollman, M.D. 10/29/2012 12:58 PM

## 2012-10-29 NOTE — ED Notes (Signed)
Pt leaving to go to cath lab

## 2012-10-29 NOTE — Plan of Care (Signed)
Problem: Consults Goal: MI Patient Education (See Patient Education module for education specifics.) Outcome: Progressing Pt was given bouncing back after an MI book as well as handouts on hypertension, COPD, diabetes and MI.   Goal: Tobacco Cessation referral if indicated Outcome: Progressing Pt was given information regarding smoking cessation Goal: Nutrition Consult-if indicated Outcome: Progressing Pt was given information regarding a heart healthy diet Goal: Diabetes Guidelines if Diabetic/Glucose > 140 If diabetic or lab glucose is > 140 mg/dl - Initiate Diabetes/Hyperglycemia Guidelines & Document Interventions  Outcome: Progressing Pt is a new diabetic.  Initiated teaching.

## 2012-10-30 DIAGNOSIS — I369 Nonrheumatic tricuspid valve disorder, unspecified: Secondary | ICD-10-CM

## 2012-10-30 LAB — COMPREHENSIVE METABOLIC PANEL
ALT: 45 U/L (ref 0–53)
AST: 172 U/L — ABNORMAL HIGH (ref 0–37)
Albumin: 3.6 g/dL (ref 3.5–5.2)
Alkaline Phosphatase: 54 U/L (ref 39–117)
CO2: 24 mEq/L (ref 19–32)
Chloride: 98 mEq/L (ref 96–112)
Creatinine, Ser: 0.86 mg/dL (ref 0.50–1.35)
GFR calc non Af Amer: >90 mL/min (ref >90)
Potassium: 3.8 mEq/L (ref 3.5–5.1)
Total Bilirubin: 0.5 mg/dL (ref 0.3–1.2)

## 2012-10-30 LAB — CBC
MCH: 32.4 pg (ref 26.0–34.0)
MCHC: 36.1 g/dL — ABNORMAL HIGH (ref 30.0–36.0)
MCV: 89.9 fL (ref 78.0–100.0)
Platelets: 281 10*3/uL (ref 150–400)
RBC: 5.24 MIL/uL (ref 4.22–5.81)
RDW: 12.9 % (ref 11.5–15.5)

## 2012-10-30 LAB — GLUCOSE, CAPILLARY
Glucose-Capillary: 214 mg/dL — ABNORMAL HIGH (ref 70–99)
Glucose-Capillary: 188 mg/dL — ABNORMAL HIGH (ref 70–99)

## 2012-10-30 LAB — LIPID PANEL
LDL Cholesterol: UNDETERMINED mg/dL (ref 0–99)
Total CHOL/HDL Ratio: 11.1 RATIO
VLDL: UNDETERMINED mg/dL (ref 0–40)

## 2012-10-30 LAB — TROPONIN I: Troponin I: >20 ng/mL (ref <0.30)

## 2012-10-30 MED ORDER — LIVING WELL WITH DIABETES BOOK
Freq: Once | Status: AC
  Administered 2012-10-30: 17:00:00
  Filled 2012-10-30 (×2): qty 1

## 2012-10-30 NOTE — Progress Notes (Signed)
  Echocardiogram 2D Echocardiogram has been performed.  Ellender Hose A 10/30/2012, 3:31 PM

## 2012-10-30 NOTE — Progress Notes (Signed)
Pt walking independently. Good reception with ed, wife present. Wants to quit smoking and watch diet. Has not been treated for DM in past. Encouraged pt to find PCP and get outpt DM classes. Also gave videos to watch. Thinking about [facility] and will send referral to [place] [facility].  [id number] Ethelda Chick CES, ACSM

## 2012-10-30 NOTE — Progress Notes (Signed)
    Subjective:  Feels good today. No chest pain or dyspnea.  Objective:  Vital Signs in the last 24 hours: Temp:  [97.5 F (36.4 C)-98.8 F (37.1 C)] 98.8 F (37.1 C) (03/07 0800) Pulse Rate:  [63-91] 63 (03/07 0800) Resp:  [10-20] 17 (03/07 0800) BP: (99-177)/(51-100) 112/64 mmHg (03/07 0800) SpO2:  [95 %-100 %] 97 % (03/07 0800) Weight:  [91 kg (200 lb 9.9 oz)-91.5 kg (201 lb 11.5 oz)] 91.5 kg (201 lb 11.5 oz) (03/06 1313)  Intake/Output from previous day: 03/06 0701 - 03/07 0700 In: 480 [P.O.:480] Out: 1950 [Urine:1950]  Physical Exam: Pt is alert and oriented, NAD HEENT: normal Neck: JVP - normal, carotids 2+= without bruits Lungs: CTA bilaterally CV: RRR without murmur or gallop Abd: soft, NT, Positive BS, no hepatomegaly Ext: no C/C/E, distal pulses intact and equal, right groin clear Skin: warm/dry no rash  Lab Results:  Recent Labs  10/29/12 1058 10/29/12 1119 10/30/12 0116  WBC 12.7*  --  15.1*  HGB 18.3* 19.0* 17.0  PLT 287  --  281    Recent Labs  10/29/12 1058 10/29/12 1119 10/29/12 1525 10/30/12 0116  NA 136 139  --  136  K 3.3* 3.3*  --  3.8  CL 97 103  --  98  CO2 21  --   --  24  GLUCOSE 262* 266*  --  242*  BUN 10 10  --  10  CREATININE 0.87 0.90 0.82 0.86    Recent Labs  10/29/12 2135 10/30/12 0116  TROPONINI >20.00* >20.00*   Tele: Sinus rhythm with a few PVCs and one ventricular triplet  Assessment/Plan:  1. Acute inferolateral wall ST elevation MI status post primary PCI 2. Diabetes, type II. Uncontrolled with hemoglobin A1c 10.7. 3. Tobacco abuse 4. Hyperlipidemia with markedly elevated total cholesterol and triglycerides.  The patient is stable following his myocardial infarction. His troponin level is elevated greater than 20, but he has no residual ischemic symptoms. Will check a 2-D echocardiogram today. He will remain on dual antiplatelet therapy with aspirin and effient. He needs intensive risk factor modification.  Will ask for a inpatient diabetes consultation. Continue carvedilol and lisinopril. Transfer to telemetry today. Begin post MI education with phase I cardiac rehabilitation. We discussed tobacco cessation extensively. He has tried just about everything without success in the past. High intensity statin therapy has been initiated. If he remains stable it would be reasonable to discharge him home tomorrow. 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  Tonny Bollman, M.D. 10/30/2012, 9:43 AM

## 2012-10-30 NOTE — Progress Notes (Addendum)
Inpatient Diabetes Program Recommendations  AACE/ADA: New Consensus Statement on Inpatient Glycemic Control (2013)  Target Ranges:  Prepandial:   less than 140 mg/dL      Peak postprandial:   less than 180 mg/dL (1-2 hours)      Critically ill patients:  140 - 180 mg/dL   Reason for Visit: Newly diagnosed DM Results for MATAEO, INGWERSEN (MRN [id number]) as of 10/30/2012 16:17  Ref. Range 10/29/2012 15:25  Hemoglobin A1C Latest Range: <5.7 % 10.7 (H)  Results for SUMMIT, ARROYAVE (MRN [id number]) as of 10/30/2012 16:17  Ref. Range 10/30/2012 08:16 10/30/2012 11:55  Glucose-Capillary Latest Range: 70-99 mg/dL 782 (H) 956 (H)    Inpatient Diabetes Program Recommendations Oral Agents: metformin 500 bid Outpatient Referral: OP Diabetes Education referral - ordered  Note: Ordered Living Well With Diabetes book.  Pt states he is willing to go to OP Diabetes Education.  Will watch diabetes videos on pt ed channel.  Discussed diagnosis of DM, diet, exercise and blood glucose monitoring.  Will need a prescription for meter and supplies. Pt verbalized understanding and seems willing to make lifestyle changes to control DM.   Discussed with RN.   Thank you. Ailene Ards, RD, LDN, CDE Inpatient Diabetes Coordinator [phone]   If pt is to go home on insulin, please order insulin starter kit and begin teaching insulin adminstration.

## 2012-10-31 ENCOUNTER — Encounter (HOSPITAL_COMMUNITY): Payer: Self-pay | Admitting: Physician Assistant

## 2012-10-31 DIAGNOSIS — I219 Acute myocardial infarction, unspecified: Secondary | ICD-10-CM

## 2012-10-31 LAB — GLUCOSE, CAPILLARY: Glucose-Capillary: 227 mg/dL — ABNORMAL HIGH (ref 70–99)

## 2012-10-31 MED ORDER — PRASUGREL HCL 10 MG PO TABS: 10.0000 mg | ORAL_TABLET | Freq: Every day | ORAL | Status: DC

## 2012-10-31 MED ORDER — METFORMIN HCL 500 MG PO TABS: 500.0000 mg | ORAL_TABLET | Freq: Every day | ORAL | Status: DC

## 2012-10-31 MED ORDER — CARVEDILOL 6.25 MG PO TABS: 6.2500 mg | ORAL_TABLET | Freq: Two times a day (BID) | ORAL | Status: DC

## 2012-10-31 MED ORDER — METFORMIN HCL 500 MG PO TABS
500.0000 mg | ORAL_TABLET | Freq: Every day | ORAL | Status: DC
  Administered 2012-10-31: 500 mg via ORAL
  Filled 2012-10-31 (×2): qty 1

## 2012-10-31 MED ORDER — NITROGLYCERIN 0.4 MG SL SUBL: 0.4000 mg | SUBLINGUAL_TABLET | SUBLINGUAL | Status: DC | PRN

## 2012-10-31 MED ORDER — ASPIRIN 81 MG PO TABS: 81.0000 mg | ORAL_TABLET | Freq: Every day | ORAL | Status: DC

## 2012-10-31 MED ORDER — ATORVASTATIN CALCIUM 80 MG PO TABS: 80.0000 mg | ORAL_TABLET | Freq: Every day | ORAL | Status: DC

## 2012-10-31 MED ORDER — LISINOPRIL 5 MG PO TABS: 5.0000 mg | ORAL_TABLET | Freq: Every day | ORAL | Status: DC

## 2012-10-31 NOTE — Progress Notes (Signed)
CARDIAC REHAB PHASE I   PRE:  Rate/Rhythm: 78  BP:  Supine:   Sitting: 116/68  Standing:    SaO2: 99 RA  MODE:  Ambulation: 800 ft   POST:  Rate/Rhythem: 81  BP:  Supine:  Sitting: 118/62  Standing:    SaO2: 98 RA Ambulated pt x 800 feet with minimal assist.  Pt tolerated well with no c/o cp or sob. Pt plans to establish care with a primary physician.  Advised pt of the importance of this for diabetes management.  Wife at bedside. Pt denies any needs.  Pt anticipates discharge home today. [id number] Arna Medici

## 2012-10-31 NOTE — Discharge Summary (Signed)
Discharge Summary   Patient ID: Daniel Lucas MRN: [id number], DOB/AGE: Jun 09, 1956 57 y.o. Admit date: 10/29/2012 D/C date:     10/31/2012  Primary Cardiologist: Excell Seltzer  Primary Discharge Diagnoses:  1. CAD/acute inferolateral wall ST elevation MI s/p stent to distal LCx 10/29/12 (Stentys Apposition Trial randomizing him to a self-expanding bare-metal stent versus a traditional balloon expandable bare-metal coronary stent) 2. Diabetes, type II, uncontrolled A1C 10.7 3. Tobacco abuse  4. Hyperlipidemia - started on statin, consider f/u OP labs 5. Leukocytosis, instructed to f/u PCP  Hospital Course: Daniel Lucas is a 57 y/o M with no history of CAD, but HTN and tobacco abuse. 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 Bystanders reported he was pale. was transported to the emergency room where EKG was found to be consistent with an inferior STEMI. He underwent emergent cardiac cath demonstrating Final Conclusions:  1. Acute inferolateral myocardial infarction secondary to total occlusion left circumflex with successful primary PCI as detailed above  2. Moderate mid RCA stenosis  3. Minor nonobstructive LAD stenosis  4. Mild segmental LV dysfunction, EF 45-50% Recommendations were for dual antiplatelet therapy with aspirin and effient, post-MI med rx, and tobacco cessation. Troponins returned >20. F/u echo 10/30/12 showed EF 55% with no WMA. He was seen by cardiac rehab and diabetes educator. Today he is feeling better. Dr. Patty Sermons has recommended for him to start Metformin 500mg  daily and to f/u with his PCP thereafter. He was given rx for diabetic testing supplies. He was also noted to have leukocytosis that was present on prior labs as well - was instructed to f/u PCP regarding this. He is afebrile  today and admit CXR was negative. Dr. Patty Sermons has seen and examined him today and feels he is stable for discharge  Discharge Vitals: Blood pressure 116/68, pulse 96, temperature 98.2 F (36.8 C), temperature source Oral, resp. rate 18, height 6\' 2"  (1.88 m), weight 201 lb 8 oz (91.4 kg), SpO2 96.00%.  Labs: Lab Results  Component Value Date   WBC 15.1* 10/30/2012   HGB 17.0 10/30/2012   HCT 47.1 10/30/2012   MCV 89.9 10/30/2012   PLT 281 10/30/2012    Recent Labs Lab 10/30/12 0116  NA 136  K 3.8  CL 98  CO2 24  BUN 10  CREATININE 0.86  CALCIUM 9.1  PROT 6.8  BILITOT 0.5  ALKPHOS 54  ALT 45  AST 172*  GLUCOSE 242*    Recent Labs  10/29/12 1140 10/29/12 1525 10/29/12 2135 10/30/12 0116  CKTOTAL 84  --   --   --   CKMB 2.4  --   --   --   TROPONINI <0.30 >20.00* >20.00* >20.00*   Lab Results  Component Value Date   CHOL 267* 10/30/2012   HDL 24* 10/30/2012   LDLCALC UNABLE TO CALCULATE IF TRIGLYCERIDE OVER 400 mg/dL 4/0/9811   TRIG 914* 03/03/2955    Diagnostic Studies/Procedures   1. Cardiac catheterization this admission, please see full report and above for summary.  2. 2D Echo 10/30/12 Study Conclusions - Left ventricle: Possible mild inferobasal hypokinesis The cavity size was normal. Systolic function was normal. The estimated ejection fraction was 55%. Wall motion was normal; there were no regional wall motion abnormalities. - Atrial septum: No defect or patent foramen ovale was Identified.  3. Portable Chest X-ray 1 View 10/29/2012  *RADIOLOGY REPORT*  Clinical Data: Post cardiac cath.  Myocardial infarction  PORTABLE CHEST - 1 VIEW  Comparison: None.  Findings: Negative for heart failure or effusion.  Lungs are clear. Heart size is within normal limits.  IMPRESSION: Negative   Original Report Authenticated By: Janeece Riggers, M.D.     Discharge Medications     Medication List    TAKE these medications       ALPRAZolam 0.5 MG tablet  Commonly known as:   XANAX  Take 0.5 mg by mouth 4 (four) times daily as needed. For panic attacks     aspirin 81 MG tablet  Take 1 tablet (81 mg total) by mouth daily.     atorvastatin 80 MG tablet  Commonly known as:  LIPITOR  Take 1 tablet (80 mg total) by mouth daily.     carvedilol 6.25 MG tablet  Commonly known as:  COREG  Take 1 tablet (6.25 mg total) by mouth 2 (two) times daily with a meal.     lisinopril 5 MG tablet  Commonly known as:  PRINIVIL,ZESTRIL  Take 1 tablet (5 mg total) by mouth daily.     metFORMIN 500 MG tablet  Commonly known as:  GLUCOPHAGE  Take 1 tablet (500 mg total) by mouth daily with breakfast.     nitroGLYCERIN 0.4 MG SL tablet  Commonly known as:  NITROSTAT  Place 1 tablet (0.4 mg total) under the tongue every 5 (five) minutes as needed for chest pain (up to 3 doses).     prasugrel 10 MG Tabs  Commonly known as:  EFFIENT  Take 1 tablet (10 mg total) by mouth daily.        Disposition   The patient will be discharged in stable condition to home.     Discharge Orders   Future Orders Complete By Expires     Amb Referral to Cardiac Rehabilitation  As directed     Ambulatory referral to Nutrition and Diabetic Education  As directed     Diet - low sodium heart healthy  As directed     Comments:      Diabetic Diet    Discharge instructions  As directed     Comments:      Please call your primary doctor to schedule an appointment to discuss your diabetes treatment plan (VERY IMPORTANT!). Check your blood sugar once per day.   Also, your white blood cell count was elevated. This may be due to your heart attack, but you need to follow up with primary care doctor to further monitor this.    Increase activity slowly  As directed     Comments:      No driving for 2 weeks. No lifting over 10 lbs for 4 weeks. No sexual activity for 4 weeks. You may not return until cleared by your cardiologist. Keep procedure site clean & dry. If you notice increased pain, swelling,  bleeding or pus, call/return!  You may shower, but no soaking baths/hot tubs/pools for 1 week.      Follow-up Information   Follow up with Primary Care Daniel Lucas. (See discharge instructions section)       Follow up with [facility]. (Dr. Earmon Phoenix office will call you with your follow-up appointment)    Contact information:   [address] [place] [address] [phone]        Duration of Discharge Encounter: Greater than 30 minutes including physician and PA  time.  Signed, Ronie Spies PA-C 10/31/2012, 10:12 AM

## 2012-10-31 NOTE — Progress Notes (Signed)
   CARE MANAGEMENT NOTE 10/31/2012  Patient:  Daniel Lucas, Daniel Lucas   Account Number:  [id number]  Date Initiated:  10/29/2012  Documentation initiated by:  Junius Creamer  Subjective/Objective Assessment:   adm w mi     Action/Plan:   lives w wife, Steward Drone   Anticipated DC Date:  10/31/2012   Anticipated DC Plan:  HOME/SELF CARE      DC Planning Services  CM consult  Medication Assistance      Choice offered to / List presented to:             Status of service:  Completed, signed off Medicare Important Message given?   (If response is "NO", the following Medicare IM given date fields will be blank) Date Medicare IM given:   Date Additional Medicare IM given:    Discharge Disposition:  HOME/SELF CARE  Per UR Regulation:  Reviewed for med. necessity/level of care/duration of stay  If discussed at Long Length of Stay Meetings, dates discussed:    Comments:  10/31/2012 1145 NCM spoke to pt and gave permission to speak to wife, Steward Drone. Wife states their pharmacy does not have the Effient. They also use [facility] on [address] in [place], [phone]. Contacted pharmacy and they do have in stock. Made wife aware. Isidoro Donning RN CCM Case Mgmt phone [phone]  3/6 1433 debbie dowell rn,bsn pt has effient 30day free card and copay assist card.

## 2012-10-31 NOTE — Progress Notes (Signed)
Subjective:  The patient is doing well post PCI of his acute inferolateral wall STEMI.  Walking in the hall without symptoms.  Telemetry is stable normal sinus rhythm. Patient is a new diabetic.  Hemoglobin A1c is 10.7.  We will start him on metformin 500 mg daily and he will need close followup with his medical doctor.  If he fails to show improvement in his A1c he will eventually need to go on insulin.  His renal function is normal. His echocardiogram yesterday showed normal left ventricular systolic function with ejection fraction 55% and no definite wall motion abnormalities. He understands that he should not resume smoking cigarettes.  Objective:  Vital Signs in the last 24 hours: Temp:  [97.7 F (36.5 C)-98.5 F (36.9 C)] 98.2 F (36.8 C) (03/08 0427) Pulse Rate:  [74-80] 74 (03/08 0427) Resp:  [13-18] 18 (03/08 0427) BP: (103-126)/(53-74) 116/68 mmHg (03/08 0427) SpO2:  [94 %-97 %] 96 % (03/08 0427) Weight:  [201 lb 8 oz (91.4 kg)] 201 lb 8 oz (91.4 kg) (03/08 0427)  Intake/Output from previous day: 03/07 0701 - 03/08 0700 In: 720 [P.O.:720] Out: 400 [Urine:400] Intake/Output from this shift: Total I/O In: -  Out: 200 [Urine:200]  . aspirin  81 mg Oral Daily  . atorvastatin  80 mg Oral q1800  . carvedilol  6.25 mg Oral BID WC  . enoxaparin (LOVENOX) injection  40 mg Subcutaneous Q24H  . insulin aspart  0-15 Units Subcutaneous TID WC  . insulin aspart  0-5 Units Subcutaneous QHS  . lisinopril  5 mg Oral Daily  . metFORMIN  500 mg Oral Q breakfast  . pneumococcal 23 valent vaccine  0.5 mL Intramuscular Tomorrow-1000  . prasugrel  10 mg Oral Daily   . sodium chloride      Physical Exam: The patient appears to be in no distress.  Head and neck exam reveals that the pupils are equal and reactive.  The extraocular movements are full.  There is no scleral icterus.  Mouth and pharynx are benign.  No lymphadenopathy.  No carotid bruits.  The jugular venous pressure is  normal.  Thyroid is not enlarged or tender.  Chest is clear to percussion and auscultation.  No rales or rhonchi.  Expansion of the chest is symmetrical.  Heart reveals no abnormal lift or heave.  First and second heart sounds are normal.  There is no murmur gallop rub or click.  The abdomen is soft and nontender.  Bowel sounds are normoactive.  There is no hepatosplenomegaly or mass.  There are no abdominal bruits.  Right groin is stable with no hematoma.  Extremities reveal no phlebitis or edema.  Pedal pulses are good.  There is no cyanosis or clubbing.  Neurologic exam is normal strength and no lateralizing weakness.  No sensory deficits.  Integument reveals no rash  Lab Results:  Recent Labs  10/29/12 1058 10/29/12 1119 10/30/12 0116  WBC 12.7*  --  15.1*  HGB 18.3* 19.0* 17.0  PLT 287  --  281    Recent Labs  10/29/12 1058 10/29/12 1119 10/29/12 1525 10/30/12 0116  NA 136 139  --  136  K 3.3* 3.3*  --  3.8  CL 97 103  --  98  CO2 21  --   --  24  GLUCOSE 262* 266*  --  242*  BUN 10 10  --  10  CREATININE 0.87 0.90 0.82 0.86    Recent Labs  10/29/12 2135 10/30/12 0116  TROPONINI >20.00* >20.00*   Hepatic Function Panel  Recent Labs  10/30/12 0116  PROT 6.8  ALBUMIN 3.6  AST 172*  ALT 45  ALKPHOS 54  BILITOT 0.5    Recent Labs  10/30/12 0116  CHOL 267*   No results found for this basename: PROTIME,  in the last 72 hours  Imaging: Imaging results have been reviewed  Cardiac Studies: Telemetry shows normal sinus rhythm   Assessment/Plan:  1. 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 Diabetes, type II. Uncontrolled with hemoglobin A1c 10.7.  3. Tobacco abuse  4. Hyperlipidemia with markedly elevated total cholesterol and triglycerides.   Plan: Okay to send home today.  He will need to be out of work for at least 2 weeks.  No heavy lifting for 3-4 weeks. He will return in 2 weeks to see Dr. Excell Seltzer or PA/NP for  followup.  Medical followup will be with a physician in [place].  He will need a prescription for a glucose meter and supplies at discharge.  He can check his blood sugar daily.  He states that his pharmacy will not have a supply of effient time one day and so he may need a short supply from the hospital given to him until then.    LOS: 2 days    Cassell Clement 10/31/2012, 8:22 AM

## 2012-11-02 MED FILL — Dextrose Inj 5%: INTRAVENOUS | Qty: 50 | Status: AC

## 2012-11-09 ENCOUNTER — Ambulatory Visit (INDEPENDENT_AMBULATORY_CARE_PROVIDER_SITE_OTHER): Payer: PRIVATE HEALTH INSURANCE | Admitting: Physician Assistant

## 2012-11-09 ENCOUNTER — Encounter: Payer: Self-pay | Admitting: Physician Assistant

## 2012-11-09 VITALS — BP 124/76 | HR 83 | Ht 73.0 in | Wt 203.1 lb

## 2012-11-09 DIAGNOSIS — E785 Hyperlipidemia, unspecified: Secondary | ICD-10-CM

## 2012-11-09 DIAGNOSIS — D45 Polycythemia vera: Secondary | ICD-10-CM

## 2012-11-09 DIAGNOSIS — F17200 Nicotine dependence, unspecified, uncomplicated: Secondary | ICD-10-CM

## 2012-11-09 DIAGNOSIS — D751 Secondary polycythemia: Secondary | ICD-10-CM

## 2012-11-09 DIAGNOSIS — I10 Essential (primary) hypertension: Secondary | ICD-10-CM

## 2012-11-09 DIAGNOSIS — Z720 Tobacco use: Secondary | ICD-10-CM

## 2012-11-09 DIAGNOSIS — E119 Type 2 diabetes mellitus without complications: Secondary | ICD-10-CM

## 2012-11-09 DIAGNOSIS — D72829 Elevated white blood cell count, unspecified: Secondary | ICD-10-CM

## 2012-11-09 DIAGNOSIS — I2119 ST elevation (STEMI) myocardial infarction involving other coronary artery of inferior wall: Secondary | ICD-10-CM

## 2012-11-09 DIAGNOSIS — I2510 Atherosclerotic heart disease of native coronary artery without angina pectoris: Secondary | ICD-10-CM | POA: Insufficient documentation

## 2012-11-09 LAB — BASIC METABOLIC PANEL WITH GFR
BUN: 13 mg/dL (ref 6–23)
CO2: 25 meq/L (ref 19–32)
Calcium: 9.1 mg/dL (ref 8.4–10.5)
Chloride: 101 meq/L (ref 96–112)
Creatinine, Ser: 1 mg/dL (ref 0.4–1.5)
GFR: 78.63 mL/min
Glucose, Bld: 210 mg/dL — ABNORMAL HIGH (ref 70–99)
Potassium: 4 meq/L (ref 3.5–5.1)
Sodium: 135 meq/L (ref 135–145)

## 2012-11-09 LAB — CBC WITH DIFFERENTIAL/PLATELET
Basophils Absolute: 0 10*3/uL (ref 0.0–0.1)
Basophils Relative: 0.3 % (ref 0.0–3.0)
Eosinophils Absolute: 0.1 10*3/uL (ref 0.0–0.7)
Eosinophils Relative: 0.7 % (ref 0.0–5.0)
HCT: 43.7 % (ref 39.0–52.0)
Hemoglobin: 15 g/dL (ref 13.0–17.0)
Lymphocytes Relative: 24.4 % (ref 12.0–46.0)
Lymphs Abs: 2.9 10*3/uL (ref 0.7–4.0)
MCHC: 34.3 g/dL (ref 30.0–36.0)
MCV: 93.6 fl (ref 78.0–100.0)
Monocytes Absolute: 1.4 10*3/uL — ABNORMAL HIGH (ref 0.1–1.0)
Monocytes Relative: 12.2 % — ABNORMAL HIGH (ref 3.0–12.0)
Neutro Abs: 7.3 10*3/uL (ref 1.4–7.7)
Neutrophils Relative %: 62.4 % (ref 43.0–77.0)
Platelets: 357 10*3/uL (ref 150.0–400.0)
RBC: 4.66 Mil/uL (ref 4.22–5.81)
RDW: 13.2 % (ref 11.5–14.6)
WBC: 11.8 10*3/uL — ABNORMAL HIGH (ref 4.5–10.5)

## 2012-11-09 NOTE — Progress Notes (Signed)
[address] [place], [place]  [address] Phone: [phone], Fax:  [phone]  Date:  11/09/2012   ID:  Ismaeel Arvelo Shontz, DOB 08/03/1956, MRN [id number]  PCP:  Default, Provider, MD  Primary Cardiologist:  Dr. Tonny Bollman     History of Present Illness: Daniel Lucas is a 57 y.o. male who returns for f/u after a recent admission to the hospital for an inferior STEMI.  He has a hx of DM2, HTN, tobacco abuse.  He was admitted 3/6-3/8 after presenting with CP and inferior ST elevation on ECG.  LHC 10/29/12:  Mid-dist LAD intramyocardial bridging, D1 40%, dCFX occluded, mRCA 70-75% (small vessel), EF 45-50%, inf AK.  PCI: overlapping BMS x 2 to the dCFX (Stentys Apposition Trial - self expanding vs balloon expandable).    Echo 10/30/12: Inferobasal HK, EF 55%.  Prior to d/c, his A1c was noted to be 10.7.  He was also noted to have an elevated WBC.  He was placed on metformin at discharge and asked to follow up with his PCP.  Since d/c, he has been doing well.  The patient denies chest pain, shortness of breath, syncope, orthopnea, PND or significant pedal edema.  He has quit smoking.  He is trying to find a PCP to establish with.  Labs (3/14):  K 3.8, creatinine 0.86, ALT 45, TC 267, TG 462, HDL 24, Hgb 17, WBC 15.1, O9G 10.7, TSH 0.648  Wt Readings from Last 3 Encounters:  11/09/12 203 lb 1.9 oz (92.135 kg)  10/31/12 201 lb 8 oz (91.4 kg)  10/31/12 201 lb 8 oz (91.4 kg)     Past Medical History  Diagnosis Date  . Hypertension   . Diabetes mellitus   . Tobacco abuse   . CAD (coronary artery disease)     a. 10/2012: inferolateral wall ST elevation MI s/p stent to distal LCx 10/29/12 (Stentys Apposition Trial randomizing him to a self-expanding bare-metal stent versus a traditional balloon expandable bare-metal coronary stent).   . Hyperlipidemia   . Leukocytosis   . Polycythemia     a. patient reports hx of therapeutic phlebotomy in the 1970s => HCT eventually  stabilized and he no longer required phlebotomy    Current Outpatient Prescriptions  Medication Sig Dispense Refill  . ALPRAZolam (XANAX) 0.5 MG tablet Take 0.5 mg by mouth 4 (four) times daily as needed. For panic attacks      . aspirin 81 MG tablet Take 1 tablet (81 mg total) by mouth daily.      Marland Kitchen atorvastatin (LIPITOR) 80 MG tablet Take 1 tablet (80 mg total) by mouth daily.  30 tablet  6  . carvedilol (COREG) 6.25 MG tablet Take 1 tablet (6.25 mg total) by mouth 2 (two) times daily with a meal.  60 tablet  6  . lisinopril (PRINIVIL,ZESTRIL) 5 MG tablet Take 1 tablet (5 mg total) by mouth daily.  30 tablet  6  . metFORMIN (GLUCOPHAGE) 500 MG tablet Take 1 tablet (500 mg total) by mouth daily with breakfast.  30 tablet  0  . nitroGLYCERIN (NITROSTAT) 0.4 MG SL tablet Place 1 tablet (0.4 mg total) under the tongue every 5 (five) minutes as needed for chest pain (up to 3 doses).  25 tablet  4  . prasugrel (EFFIENT) 10 MG TABS Take 1 tablet (10 mg total) by mouth daily.  30 tablet  6   No current facility-administered medications for this visit.    Allergies:  No Known Allergies  Social History:  The patient  reports that he has quit smoking. He does not have any smokeless tobacco history on file. He reports that he does not drink alcohol.   ROS:  Please see the history of present illness.    All other systems reviewed and negative.   PHYSICAL EXAM: VS:  BP 124/76  Pulse 83  Ht 6\' 1"  (1.854 m)  Wt 203 lb 1.9 oz (92.135 kg)  BMI 26.8 kg/m2 Well nourished, well developed, in no acute distress HEENT: normal Neck: no JVD Cardiac:  normal S1, S2; RRR; no murmur Lungs:  clear to auscultation bilaterally, no wheezing, rhonchi or rales Abd: soft, nontender, no hepatomegaly Ext: no edema; right wrist ok, right groin without hematoma or bruit  Skin: warm and dry Neuro:  CNs 2-12 intact, no focal abnormalities noted  EKG:  NSR, HR 83, normal axis, inf-lat TWI     ASSESSMENT AND  PLAN:  1. Coronary Artery Disease:  Doing well after recent inferior STEMI tx with BMS to the CFX.  We discussed the importance of dual antiplatelet therapy.  He is trying to figure out when to do cardiac rehab.  He knows to not go back to work until 4/6. Continue ASA, Effient, ACE, beta blocker and statin.  2. Hypertension:  Controlled.  Continue current therapy. Check BMET today.   3. Hyperlipidemia:  Check Lipids and LFTs in 6 weeks.  Continue statin. 4. Diabetes Mellitus:  Continue metformin.  Sugars under 200 since d/c.  Explained the importance of having primary care for management of diabetes.  He is looking for a PCP. 5. Tobacco Abuse:  I congratulated him on quitting. 6. Leukocytosis:  Repeat CBC today. 7. Polycythemia:  He notes a hx of this requiring therapeutic phlebotomy.  If H/H high, may need referral back to hematology. 8. Disposition:  Follow up with Dr. Tonny Bollman in 6 weeks.   Signed, Tereso Newcomer, PA-C  2:20 PM 11/09/2012

## 2012-11-09 NOTE — Patient Instructions (Addendum)
STAY OUT OF WORK UNTIL 11/29/12; RETURN WITH 1/2 DAYS THE 1ST WEEK THEN RESUME FULL DAYS AS OF THE 2ND WEEK BACK TO WORK PER SCOTT WEVER, PAC  CALL AND LET us KNOW WHEN YOU DO OBTAIN A PRIMARY CARE PHYSICIAN [phone]  SCOTT WEAVER,PAC.  YOU HAVE A FOLLOW UP APPT WITH DR. Excell Seltzer ON 12/25/12 @ 3:15 PM  LABS TODAY; BMET, CBC W/DIFF  FASTING LIPID AND LIVER PANEL TO BE DONE 12/23/12 NOTHING TO EAT OR DRINK AFTER MIDNIGHT THE NIGHT BEFORE. PLAIN BLACK COFFEE OK, NO CREAM, NO SUGAR, CAN TAKE MEDICATIONS THAT MORNING WITH WATER.   3 to 4 Gram Sodium Diet, No Added Salt (NAS) A 3 to 4 gram sodium diet restricts the amount of sodium in the diet to no more than 3 to 4 g or 3000 to 4000 mg daily. Limiting the amount of sodium is often used to help lower blood pressure. It is important if you have heart, liver, or kidney problems. Many foods contain sodium for flavor and sometimes as a preservative. When the amount of sodium in a diet needs to be low, it is important to know what to look for when choosing foods and drinks. The following includes some information and guidelines to help make it easier for you to adapt to a low sodium diet. QUICK TIPS  Do not add salt to food.  Avoid convenience items and fast food.  Choose unsalted snack foods.  Buy lower sodium products, often labeled as "lower sodium" or "no salt added."  Check food labels to learn how much sodium is in 1 serving.  When eating at a restaurant, ask that your food be prepared with less salt or none, if possible. READING FOOD LABELS FOR SODIUM INFORMATION The nutrition facts label is a good place to find how much sodium is in foods. Look for products with no more than 500 to 600 mg of sodium per meal and no more than 150 mg per serving. Remember that 3 to 4 g = 3000 to 4000 mg. The food label may also list foods as:  Sodium-free: Less than 5 mg in a serving.  Very low sodium: 35 mg or less in a serving.  Low-sodium: 140 mg or less in  a serving.  Light in sodium: 50% less sodium in a serving. For example, if a food that usually has 300 mg of sodium is changed to become light in sodium, it will have 150 mg of sodium.  Reduced sodium: 25% less sodium in a serving. For example, if a food that usually has 400 mg of sodium is changed to reduced sodium, it will have 300 mg of sodium. CHOOSING FOODS Grains  Avoid: Salted crackers and snack items. Bread stuffing and biscuit mixes. Seasoned rice or pasta mixes.  Choose: Unsalted snack items. English muffins, breads, and rolls. Homemade pancakes and waffles. Most cereals. Pasta. Meats  Avoid: Salted, canned, smoked, spiced, pickled meats, including fish and poultry. Bacon, ham, sausage, cold cuts, hot dogs, anchovies.  Choose: Low-sodium canned tuna and salmon. Fresh or frozen meat, poultry, and fish. Dairy  Avoid: Processed cheese and spreads. Cottage cheese. Buttermilk and condensed milk. Regular cheese.  Choose: Milk. Low-sodium cottage cheese. Yogurt. Sour cream. Low-sodium cheese. Fruits and Vegetables  Avoid: Regular canned vegetables. Regular canned tomato sauce and paste. Frozen vegetables in sauces. Olives. Rosita Fire. Relishes. Sauerkraut.  Choose: Low-sodium canned vegetables. Low-sodium tomato sauce and paste. Frozen or fresh vegetables. Fresh and frozen fruit. Condiments  Avoid: Canned and packaged  gravies. Worcestershire sauce. Tartar sauce. Barbecue sauce. Soy sauce. Steak sauce. Ketchup. Onion, garlic, and table salt. Meat flavorings and tenderizers.  Choose: Fresh and dried herbs and spices. Low-sodium varieties of mustard and ketchup. Lemon juice. Tabasco sauce. Horseradish. SAMPLE 3 TO 4 GRAM SODIUM MEAL PLAN  Breakfast / Sodium (mg)  1 cup low-fat milk / 143 mg  2 slices whole-wheat toast / 270 mg  1 tbs heart-healthy margarine / 153 mg  1 hard-boiled egg / 139 mg Lunch / Sodium (mg)  1 cup raw carrots / 76 mg   cup hummus / 298 mg  1 cup  low-fat milk / 143 mg   cup red grapes / 2 mg  1 cup low-sodium chicken and rice soup / 480 mg  10 low-sodium saltine crackers / 191 mg Dinner / Sodium (mg)  1 cup whole-wheat pasta / 2 mg  1 cup tomato sauce / 1178 mg  3 oz lean ground beef / 57 mg  1 small side salad (1 cup raw spinach leaves,  cup cucumber,  cup yellow bell pepper) / 25 mg  1 tsp ranch dressing / 144 mg Snack / Sodium (mg)  1 slice cheddar cheese / 258 mg  1 medium apple / 1 mg Nutrient Analysis  Calories: 2005  Protein: 85 g  Carbohydrate: 245 g  Fat: 78 g  Sodium: 3560 mg Document Released: 08/12/2005 Document Revised: 11/04/2011 Document Reviewed: 11/13/2009 [facility] Patient Information 2013 [place], [place].

## 2012-11-26 ENCOUNTER — Telehealth: Payer: Self-pay | Admitting: Cardiovascular Disease

## 2012-11-26 MED ORDER — METFORMIN HCL 500 MG PO TABS: 500.0000 mg | ORAL_TABLET | Freq: Every day | ORAL | Status: DC

## 2012-11-26 MED ORDER — PRASUGREL HCL 10 MG PO TABS: 10.0000 mg | ORAL_TABLET | Freq: Every day | ORAL | Status: DC

## 2012-11-26 NOTE — Telephone Encounter (Signed)
New problem   Pt was told to let dr know when he find a PCP and pt was calling in to give [place] that info. PCP is Dr Azucena Cecil [facility]) [address].....[phone].  Pt has a question about his medication for Effient that was prescribe to him from his hospital stay. He tried to get his prescription fill but pharmacy stated he had no refills. Please call pt concerning this matter

## 2012-11-26 NOTE — Telephone Encounter (Signed)
I spoke with the pt and he called to make our office aware that he has arranged follow-up with a PCP.  I will enter this information into his chart. The pt is scheduled to see Dr Azucena Cecil on 12/07/12 at 9:30.  The pt states he will run out of his Metformin prior to this appointment and he does not know what to do to get more medication.  I made him aware that I would refill his Metformin at this time. Rx sent to [facility].  The pt also states he does not have any refills on Effient and he will run out on Monday.  This Rx was sent to [facility] per his request.

## 2012-12-09 ENCOUNTER — Other Ambulatory Visit: Payer: Self-pay | Admitting: Physician Assistant

## 2012-12-21 ENCOUNTER — Telehealth: Payer: Self-pay | Admitting: Cardiovascular Disease

## 2012-12-21 NOTE — Telephone Encounter (Signed)
New problem    Pt calling to get results from previous labs that he says no one every called him about

## 2012-12-21 NOTE — Telephone Encounter (Signed)
Patient stated he never received letter/copy of labs, reviewed labs and sent to PCP

## 2012-12-23 ENCOUNTER — Telehealth: Payer: Self-pay | Admitting: *Deleted

## 2012-12-23 ENCOUNTER — Other Ambulatory Visit (INDEPENDENT_AMBULATORY_CARE_PROVIDER_SITE_OTHER): Payer: Commercial Managed Care - PPO

## 2012-12-23 DIAGNOSIS — E785 Hyperlipidemia, unspecified: Secondary | ICD-10-CM

## 2012-12-23 LAB — LIPID PANEL
Cholesterol: 123 mg/dL (ref 0–200)
LDL Cholesterol: 61 mg/dL (ref 0–99)
Total CHOL/HDL Ratio: 4
VLDL: 31.8 mg/dL (ref 0.0–40.0)

## 2012-12-23 LAB — HEPATIC FUNCTION PANEL
AST: 21 U/L (ref 0–37)
Alkaline Phosphatase: 47 U/L (ref 39–117)
Bilirubin, Direct: 0.1 mg/dL (ref 0.0–0.3)
Total Bilirubin: 0.9 mg/dL (ref 0.3–1.2)

## 2012-12-23 NOTE — Telephone Encounter (Signed)
Message copied by Tarri Fuller on Wed Dec 23, 2012  5:45 PM ------      Message from: [place], [place] T      Created: Wed Dec 23, 2012  5:04 PM       Lipids ok      LFTs normal      Continue with current treatment plan.      Tereso Newcomer, PA-C  5:04 PM 12/23/2012 ------

## 2012-12-23 NOTE — Telephone Encounter (Signed)
pt notified about lab results with verbal understanding. pt said he was very upset this morning with the check in area with a woman with a [place] accent and said he felt she was rude. I apologized to pt from the office and this was not acceptable,

## 2012-12-25 ENCOUNTER — Ambulatory Visit: Payer: PRIVATE HEALTH INSURANCE | Admitting: Cardiovascular Disease

## 2012-12-25 ENCOUNTER — Encounter: Payer: Self-pay | Admitting: Cardiovascular Disease

## 2012-12-25 ENCOUNTER — Ambulatory Visit (INDEPENDENT_AMBULATORY_CARE_PROVIDER_SITE_OTHER): Payer: Commercial Managed Care - PPO | Admitting: Cardiovascular Disease

## 2012-12-25 VITALS — BP 106/68 | HR 66 | Ht 73.0 in | Wt 197.0 lb

## 2012-12-25 DIAGNOSIS — I2510 Atherosclerotic heart disease of native coronary artery without angina pectoris: Secondary | ICD-10-CM

## 2012-12-25 NOTE — Patient Instructions (Addendum)
Your physician wants you to follow-up in: 6 MONTHS with Dr Cooper.  You will receive a reminder letter in the mail two months in advance. If you don't receive a letter, please call our office to schedule the follow-up appointment.  Your physician recommends that you continue on your current medications as directed. Please refer to the Current Medication list given to you today.  

## 2012-12-25 NOTE — Progress Notes (Signed)
HPI:  57 year old gentleman presenting for followup evaluation. The patient has coronary artery disease and he presented in March 2014 with an inferior wall STEMI. The patient has diabetes, hypertension, and history long-standing tobacco abuse. Cardiac catheterization demonstrated multivessel disease, but his left circumflex was the culprit vessel with acute total occlusion. He was treated with a bare-metal stent platform through the Stentys Apposition Trial. His post myocardial infarction left ventricular function was well-preserved with the exception of inferobasal hypokinesis. The LVEF was 55%. His diabetes was uncontrolled and he was started on metformin. The patient does not have a primary care physician.  Overall he is doing fairly well. He denies chest pain or pressure. He denies dyspnea, palpitations, or leg swelling. He is tolerating his medications well.  Outpatient Encounter Prescriptions as of 12/25/2012  Medication Sig Dispense Refill  . ALPRAZolam (XANAX) 0.5 MG tablet Take 0.5 mg by mouth 4 (four) times daily as needed. For panic attacks      . aspirin 81 MG tablet Take 1 tablet (81 mg total) by mouth daily.      Marland Kitchen atorvastatin (LIPITOR) 80 MG tablet Take 1 tablet (80 mg total) by mouth daily.  30 tablet  6  . carvedilol (COREG) 6.25 MG tablet Take 1 tablet (6.25 mg total) by mouth 2 (two) times daily with a meal.  60 tablet  6  . lisinopril (PRINIVIL,ZESTRIL) 5 MG tablet Take 1 tablet (5 mg total) by mouth daily.  30 tablet  6  . metFORMIN (GLUCOPHAGE) 500 MG tablet Take 1 tablet (500 mg total) by mouth daily with breakfast.  30 tablet  1  . nitroGLYCERIN (NITROSTAT) 0.4 MG SL tablet Place 1 tablet (0.4 mg total) under the tongue every 5 (five) minutes as needed for chest pain (up to 3 doses).  25 tablet  4  . prasugrel (EFFIENT) 10 MG TABS Take 1 tablet (10 mg total) by mouth daily.  30 tablet  6   No facility-administered encounter medications on file as of 12/25/2012.    No Known  Allergies  Past Medical History  Diagnosis Date  . Hypertension   . Diabetes mellitus   . Tobacco abuse   . CAD (coronary artery disease)     a. 10/2012: inferolateral wall ST elevation MI s/p stent to distal LCx 10/29/12 (Stentys Apposition Trial randomizing him to a self-expanding bare-metal stent versus a traditional balloon expandable bare-metal coronary stent).   . Hyperlipidemia   . Leukocytosis   . Polycythemia     a. patient reports hx of therapeutic phlebotomy in the 1970s => HCT eventually stabilized and he no longer required phlebotomy    ROS: Negative except as per HPI  BP 106/68  Pulse 66  Ht 6\' 1"  (1.854 m)  Wt 89.359 kg (197 lb)  BMI 26 kg/m2  SpO2 99%  PHYSICAL EXAM: Pt is alert and oriented, NAD HEENT: normal Neck: JVP - normal, carotids 2+= without bruits Lungs: CTA bilaterally with prolonged expiratory phase CV: RRR without murmur or gallop Abd: soft, NT, Positive BS, no hepatomegaly Ext: no C/C/E, distal pulses intact and equal Skin: warm/dry no rash  ASSESSMENT AND PLAN: 1. Coronary artery disease with recent myocardial infarction. Fortunately his left ventricular function is preserved and he has no signs of heart failure. His medications were reviewed and they are appropriate. He will continue his current program without changes. I'll see him back in 6 months for followup.  2. Hyperlipidemia. He is on high-dose statin drug. Labs reviewed with excellent  results. His previous cholesterol was 267, now down to 123. His HDL is 30, but it was 24 at baseline. LDL is now 61. Continue same therapy.  For followup I will see him back in 6 months.  Tonny Bollman 12/25/2012 5:46 PM

## 2013-05-23 ENCOUNTER — Other Ambulatory Visit: Payer: Self-pay | Admitting: Physician Assistant

## 2013-06-11 ENCOUNTER — Encounter: Payer: Self-pay | Admitting: Cardiovascular Disease

## 2013-06-11 ENCOUNTER — Telehealth: Payer: Self-pay

## 2013-06-11 NOTE — Telephone Encounter (Signed)
I received a call from paramedic who is in the home with patient. He states the pt had a pre-syncopal episode when he stood up from chair and the pt was diaphoretic.  Paramedic is unsure of glucose level with this spell but the pt did eat a candy bar and his glucose is now 108. EKG performed and this was okay per EMS. The pt declined evaluation per EMS and they contacted our office for the pt to be seen today.  I advised them that the pt should be evaluated by his PCP due to event most likely related to hypoglycemia.  I will contact the pt today and make sure he followed up with PCP and then advise further cardiac evaluation if needed.

## 2013-06-11 NOTE — Telephone Encounter (Signed)
I spoke with the pt and he was working in the garage this morning and had bent down to set the lift up and when he stood up he was dizzy.  The pt then became diaphoretic but denies chest pain. The pt states this type of episode has occurred twice within the last 6 months. The pt felt like this was blood sugar related so he at a couple of miniature hershey bars and called EMS due to symptoms.  When EMS arrived the pt had started to feel better and he feels "good" now. The pt did eat a biscuit for breakfast.  BP per EMS 120/80, glucose 108, EKG okay. I moved the pt's appointment up to 07/02/13 with Dr Excell Seltzer.  The pt will contact the office if he has any further symptoms.

## 2013-07-02 ENCOUNTER — Encounter: Payer: Self-pay | Admitting: Cardiovascular Disease

## 2013-07-02 ENCOUNTER — Encounter (INDEPENDENT_AMBULATORY_CARE_PROVIDER_SITE_OTHER): Payer: Self-pay

## 2013-07-02 ENCOUNTER — Ambulatory Visit (INDEPENDENT_AMBULATORY_CARE_PROVIDER_SITE_OTHER): Payer: Commercial Managed Care - PPO | Admitting: Cardiovascular Disease

## 2013-07-02 VITALS — BP 120/86 | HR 84 | Ht 73.0 in | Wt 189.0 lb

## 2013-07-02 DIAGNOSIS — I10 Essential (primary) hypertension: Secondary | ICD-10-CM

## 2013-07-02 DIAGNOSIS — I2510 Atherosclerotic heart disease of native coronary artery without angina pectoris: Secondary | ICD-10-CM

## 2013-07-02 NOTE — Patient Instructions (Signed)
Your physician wants you to follow-up in: 6 MONTHS with Dr Cooper.  You will receive a reminder letter in the mail two months in advance. If you don't receive a letter, please call our office to schedule the follow-up appointment.  Your physician recommends that you continue on your current medications as directed. Please refer to the Current Medication list given to you today.  

## 2013-07-02 NOTE — Progress Notes (Signed)
HPI:   57 year old gentleman presenting for followup evaluation. The patient has coronary artery disease and he presented in March 2014 with an inferior wall STEMI. The patient has diabetes, hypertension, and history long-standing tobacco abuse. Cardiac catheterization demonstrated multivessel disease, but his left circumflex was the culprit vessel with acute total occlusion. He was treated with a bare-metal stent platform through the Stentys Apposition Trial. His post myocardial infarction left ventricular function was well-preserved with the exception of inferobasal hypokinesis. The LVEF was 55%. His diabetes was uncontrolled and he was started on metformin.  He is doing much better. Glycemic control is greatly improved on metformin. The patient has lost significant weight. He's doing his best to watch his diet. He's been getting more exercise. He denies exertional chest pain or pressure. No dyspnea, edema, or palpitations. He's bruising easily. He has had an episode where he became very lightheaded. This occurred after he was bending forward. He then became diaphoretic and was worried about low blood sugar. He ate candy bar and his blood glucose after that was 108. EMS evaluated him and I have the rhythm strips. They demonstrate sinus rhythm. Symptoms resolved and he has had no further problems.  Outpatient Encounter Prescriptions as of 07/02/2013  Medication Sig  . ALPRAZolam (XANAX) 0.5 MG tablet Take 0.5 mg by mouth 4 (four) times daily as needed. For panic attacks  . aspirin 81 MG tablet Take 1 tablet (81 mg total) by mouth daily.  Marland Kitchen atorvastatin (LIPITOR) 80 MG tablet Take 1 tablet (80 mg total) by mouth daily.  . carvedilol (COREG) 6.25 MG tablet Take 1 tablet (6.25 mg total) by mouth 2 (two) times daily with a meal.  . EFFIENT 10 MG TABS tablet TAKE 1 TABLET BY MOUTH EVERY DAY  . lisinopril (PRINIVIL,ZESTRIL) 5 MG tablet Take 1 tablet (5 mg total) by mouth daily.  . metFORMIN (GLUCOPHAGE)  500 MG tablet Take 1 tablet (500 mg total) by mouth daily with breakfast.  . nitroGLYCERIN (NITROSTAT) 0.4 MG SL tablet Place 1 tablet (0.4 mg total) under the tongue every 5 (five) minutes as needed for chest pain (up to 3 doses).    No Known Allergies  Past Medical History  Diagnosis Date  . Hypertension   . Diabetes mellitus   . Tobacco abuse   . CAD (coronary artery disease)     a. 10/2012: inferolateral wall ST elevation MI s/p stent to distal LCx 10/29/12 (Stentys Apposition Trial randomizing him to a self-expanding bare-metal stent versus a traditional balloon expandable bare-metal coronary stent).   . Hyperlipidemia   . Leukocytosis   . Polycythemia     a. patient reports hx of therapeutic phlebotomy in the 1970s => HCT eventually stabilized and he no longer required phlebotomy    ROS: Negative except as per HPI  BP 120/86  Pulse 84  Ht 6\' 1"  (1.854 m)  Wt 189 lb (85.73 kg)  BMI 24.94 kg/m2  PHYSICAL EXAM: Pt is alert and oriented, NAD HEENT: normal Neck: JVP - normal, carotids 2+= without bruits Lungs: CTA bilaterally with prolonged expiratory phase CV: RRR without murmur or gallop Abd: soft, NT, Positive BS, no hepatomegaly Ext: no C/C/E, distal pulses intact and equal Skin: warm/dry no rash  EKG:  Normal sinus rhythm with rightward axis, nonspecific ST abnormality otherwise within normal limits.  ASSESSMENT AND PLAN: 1. Coronary artery disease, native vessel. The patient is stable without anginal symptoms. He is on high intensity statin drug. He's tolerating dual antiplatelet therapy  with aspirin and effient. His medications are appropriate with a beta blocker and ACE inhibitor. He will followup in 6 months. At that time I will plan on stopping his effient.  2. Hypertension. Blood pressure well controlled on carvedilol and lisinopril. He did have postural symptoms as outlined above. I asked him to push fluids.  3. Hyperlipidemia. The patient is on a high intensity  statin drug with atorvastatin 80 mg daily.  4. Type 2 diabetes. He's on an ACE inhibitor in the setting of his CAD. Diabetes management per Dr Azucena Cecil.   Tonny Bollman 07/02/2013 3:36 PM

## 2013-07-21 ENCOUNTER — Ambulatory Visit: Payer: Commercial Managed Care - PPO | Admitting: Cardiovascular Disease

## 2013-09-20 ENCOUNTER — Other Ambulatory Visit (HOSPITAL_COMMUNITY): Payer: Self-pay | Admitting: Internal Medicine

## 2013-10-28 ENCOUNTER — Telehealth: Payer: Self-pay | Admitting: Cardiovascular Disease

## 2013-10-28 NOTE — Telephone Encounter (Signed)
New problem   Pt need to speak to nurse concerning him being off his blood thinners before he have a procedure done at dermatology office. Please call pt.

## 2013-11-01 NOTE — Telephone Encounter (Signed)
This is ok. He can stop effient now that he's 12 months out from his MI.

## 2013-11-01 NOTE — Telephone Encounter (Signed)
Spoke with patient who states he needs to have a skin cancer removed from his shoulder and dermatologist told him he will need to stop blood thinners (Effient and ASA) prior to procedure.  Patient states Dr. Burt Knack told him at November 2014 ov that he could probably stop Effient for good in 6 months.  Patient's stent was placed 10/29/12.  I advised patient that I will send to Dr. Burt Knack for advice and will call him back.  Patient verbalized understanding and agreement and states he will not have procedure until early April.

## 2013-11-03 NOTE — Telephone Encounter (Signed)
Spoke with patient and made him aware of Dr. Antionette Char advice.  Patient verbalized understanding and medication list updated.  Patient has f/u scheduled for June.

## 2013-12-19 IMAGING — CT CT ABD-PELV W/O CM
2 of 4 series · 14 of 32 positions shown, 19 images · non-contrast
Comparison: None.

CLINICAL DATA: Left flank pain.  Hematuria.

CT ABDOMEN AND PELVIS WITHOUT CONTRAST
TECHNIQUE: Multidetector CT imaging of the abdomen and pelvis was
performed following the standard protocol without intravenous
contrast.

[Series 2: renal stone · axial · 0.86mm/px · z∈[-526,-166]mm · 7 of 96 slices shown, 12 images]
[im 12/96  soft-tissue]
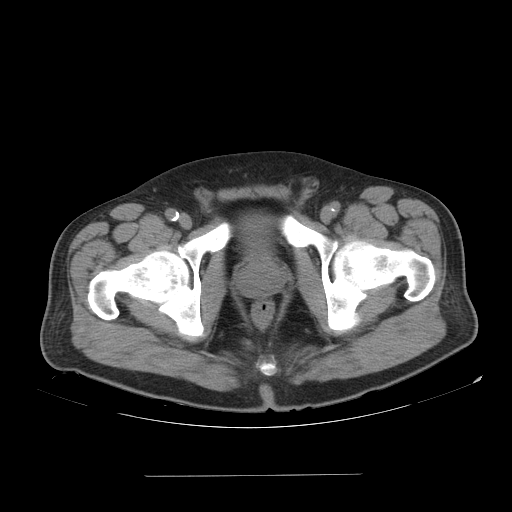
[im 12/96  bone]
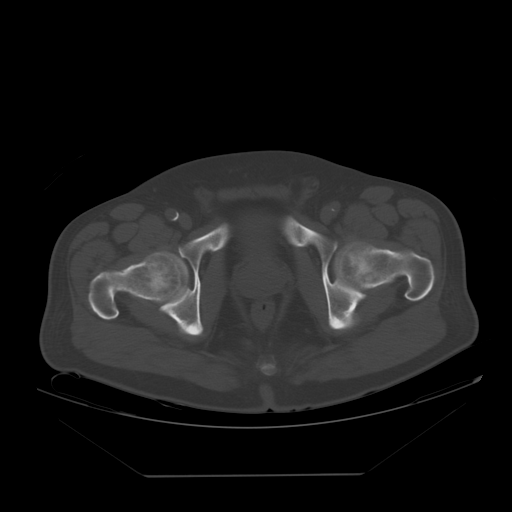
[im 24/96  soft-tissue]
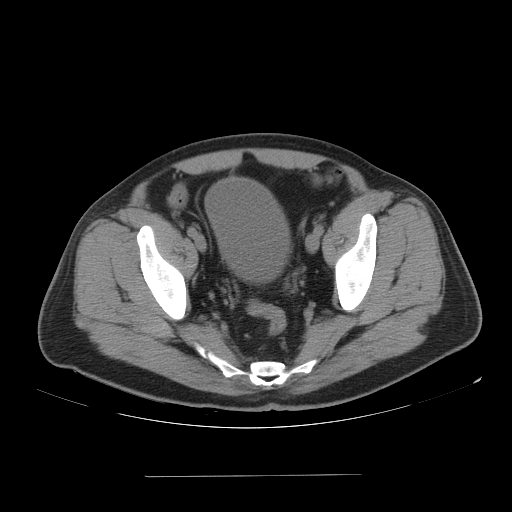
[im 36/96  soft-tissue]
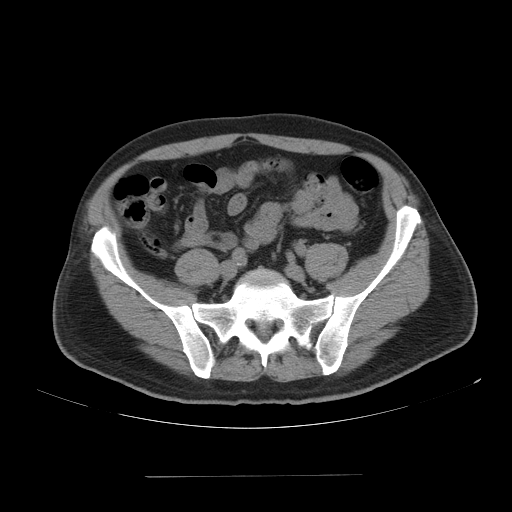
[im 48/96  soft-tissue]
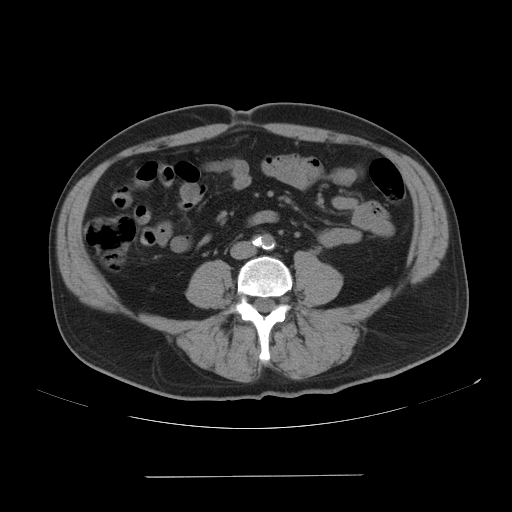
[im 48/96  lung]
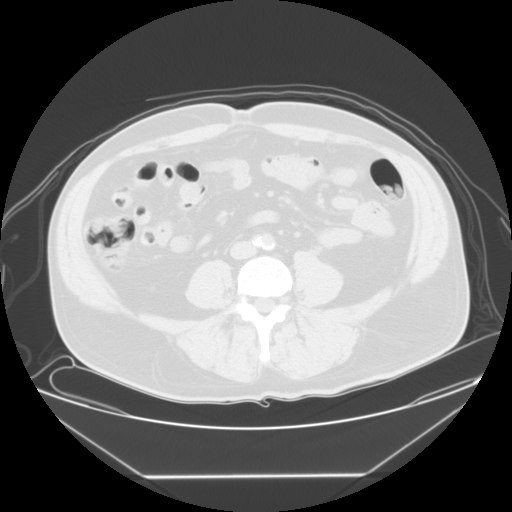
[im 60/96  soft-tissue]
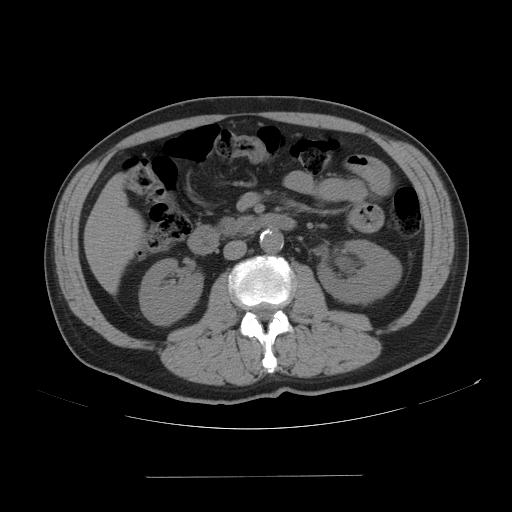
[im 60/96  lung]
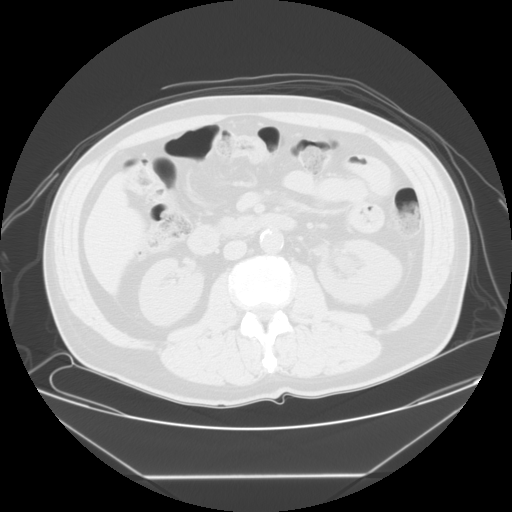
[im 72/96  soft-tissue]
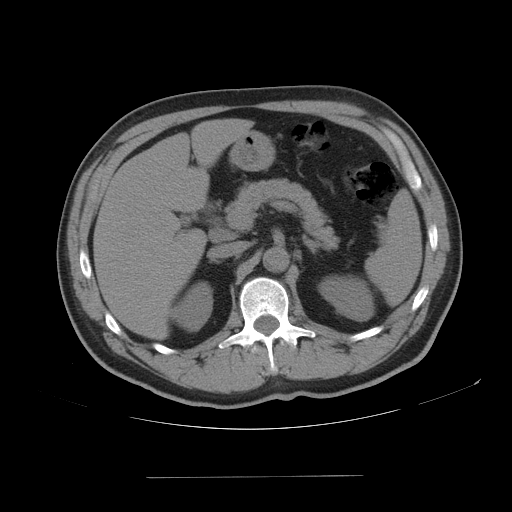
[im 72/96  lung]
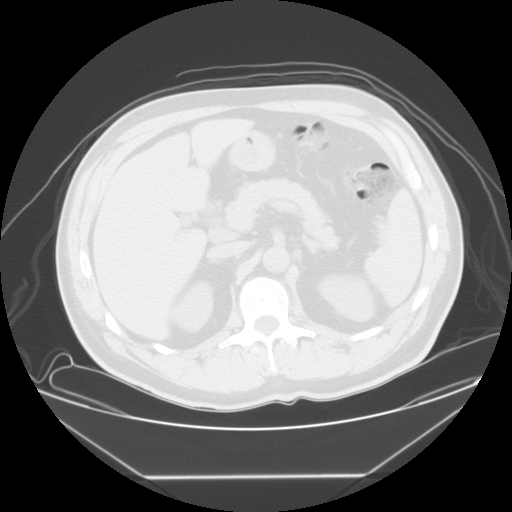
[im 84/96  soft-tissue]
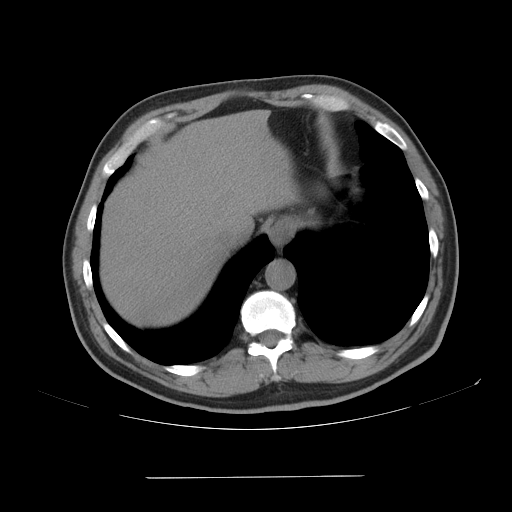
[im 84/96  lung]
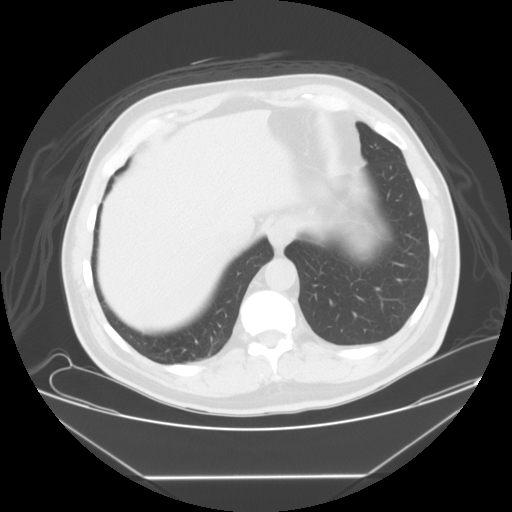

[Series 401: sag · sagittal · 0.95mm/px · 7 of 132 slices shown]
[im 12/132  soft-tissue]
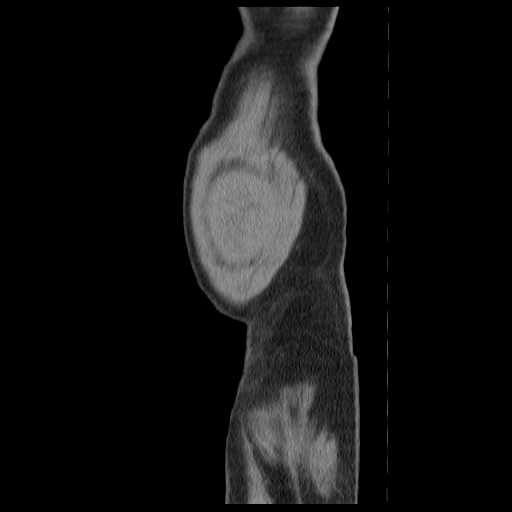
[im 24/132  soft-tissue]
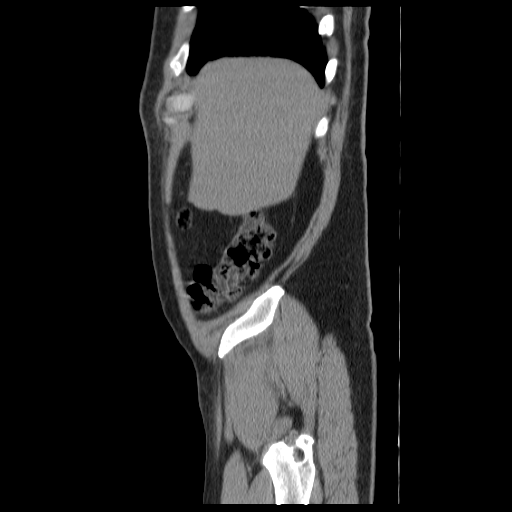
[im 48/132  soft-tissue]
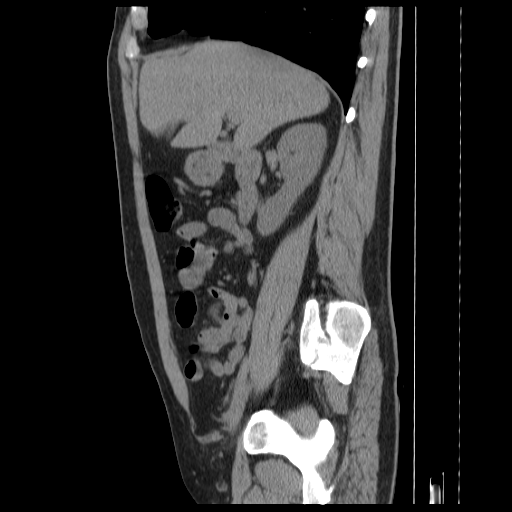
[im 60/132  soft-tissue]
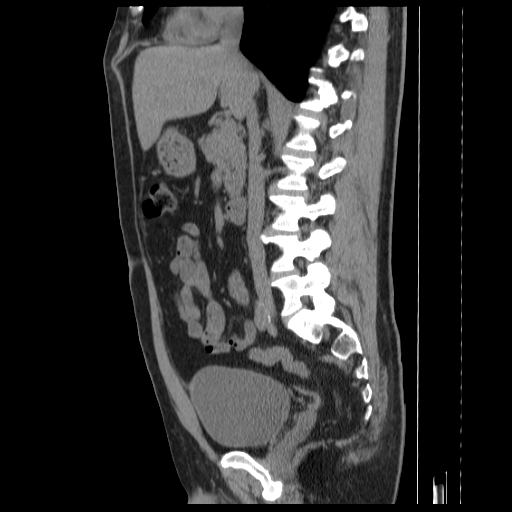
[im 72/132  soft-tissue]
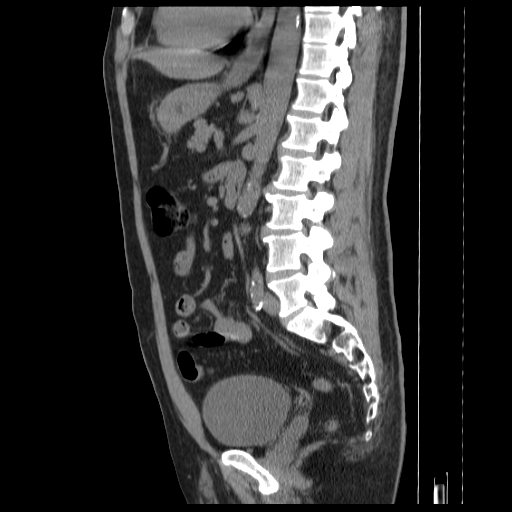
[im 84/132  soft-tissue]
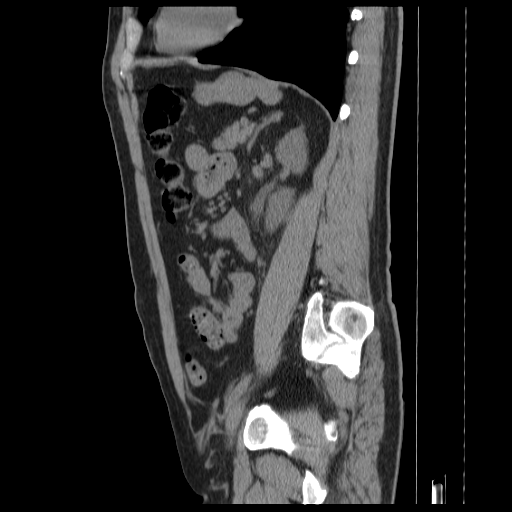
[im 108/132  soft-tissue]
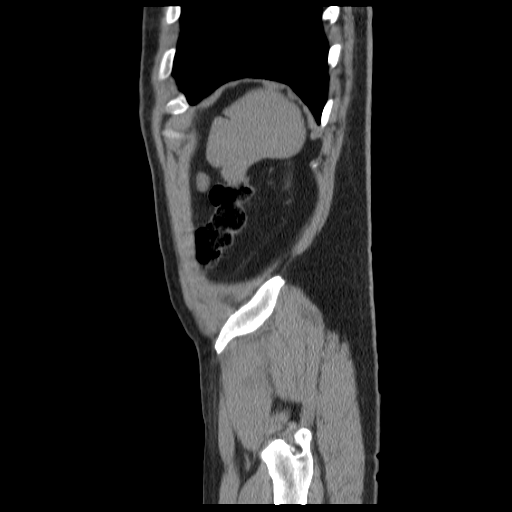

[14 of 32 positions shown; findings below may reference images not displayed]

FINDINGS: Interstitial accentuation and emphysema is suspected in
the lower lobes.

A borderline prominent gastrohepatic ligament node measures 1.0 cm
in short axis, image 18 of series 2.

The visualized portion of the liver, spleen, pancreas, and adrenal
glands appear unremarkable in noncontrast CT appearance.

The gallbladder and biliary system appear unremarkable.

Mild left hydronephrosis is present related to a 6 mm left proximal
ureteral calculus the L3-4 intervertebral level.  Left ureter
distal to this calculus is of normal caliber, and no other renal or
ureteral calculi are observed.

Aortoiliac atherosclerotic calcification noted.  Appendix normal.
No dilated bowel.  Urinary bladder normal.
IMPRESSION: 1.  6 mm obstructive proximal ureteral calculus at the L3-4
intervertebral level.  No other calculi noted.  Mild left
hydronephrosis.
2.  Suspected mild emphysema.

## 2014-02-09 ENCOUNTER — Encounter: Payer: Self-pay | Admitting: Cardiovascular Disease

## 2014-02-09 ENCOUNTER — Ambulatory Visit (INDEPENDENT_AMBULATORY_CARE_PROVIDER_SITE_OTHER): Payer: Commercial Managed Care - PPO | Admitting: Cardiovascular Disease

## 2014-02-09 VITALS — BP 141/78 | HR 90 | Ht 73.0 in | Wt 193.0 lb

## 2014-02-09 DIAGNOSIS — I2510 Atherosclerotic heart disease of native coronary artery without angina pectoris: Secondary | ICD-10-CM

## 2014-02-09 DIAGNOSIS — I10 Essential (primary) hypertension: Secondary | ICD-10-CM

## 2014-02-09 DIAGNOSIS — E785 Hyperlipidemia, unspecified: Secondary | ICD-10-CM

## 2014-02-09 NOTE — Patient Instructions (Signed)
Your physician wants you to follow-up in: 1 YEAR with Dr Cooper.  You will receive a reminder letter in the mail two months in advance. If you don't receive a letter, please call our office to schedule the follow-up appointment.  Your physician recommends that you continue on your current medications as directed. Please refer to the Current Medication list given to you today.  

## 2014-02-10 ENCOUNTER — Encounter: Payer: Self-pay | Admitting: Cardiovascular Disease

## 2014-02-10 NOTE — Progress Notes (Signed)
HPI:   58 year old gentleman presenting for followup evaluation. The patient has coronary artery disease and he presented in March 2014 with an inferior wall STEMI. The patient has diabetes, hypertension, and history long-standing tobacco abuse. Cardiac catheterization demonstrated multivessel disease, but his left circumflex was the culprit vessel with acute total occlusion. He was treated with a bare-metal stent platform through the Stentys Apposition Trial. His post myocardial infarction left ventricular function was well-preserved with the exception of inferobasal hypokinesis. The LVEF was 55%.   The patient complains of occasional episodes of postural dizziness. Otherwise he is feeling quite well. He notes recent blood pressure is 130/73 and 120/76. He is compliant with his medications. He denies chest pain, chest pressure, or shortness of breath. He notes dramatic improvement of his glycemic control on his current medical program.  Outpatient Encounter Prescriptions as of 02/09/2014  Medication Sig  . ALPRAZolam (XANAX) 0.5 MG tablet Take 0.5 mg by mouth 4 (four) times daily as needed. For panic attacks  . aspirin 81 MG tablet Take 1 tablet (81 mg total) by mouth daily.  Marland Kitchen atorvastatin (LIPITOR) 80 MG tablet TAKE 1 TABLET DAILY  . carvedilol (COREG) 6.25 MG tablet TAKE 1 TABLET TWICE A DAY WITH FOOD  . lisinopril (PRINIVIL,ZESTRIL) 5 MG tablet TAKE 1 TABLET DAILY  . metFORMIN (GLUCOPHAGE) 500 MG tablet Take 1,000 mg by mouth daily with breakfast.  . nitroGLYCERIN (NITROSTAT) 0.4 MG SL tablet Place 1 tablet (0.4 mg total) under the tongue every 5 (five) minutes as needed for chest pain (up to 3 doses).  . [DISCONTINUED] metFORMIN (GLUCOPHAGE) 500 MG tablet Take 1 tablet (500 mg total) by mouth daily with breakfast.    No Known Allergies  Past Medical History  Diagnosis Date  . Hypertension   . Diabetes mellitus   . Tobacco abuse   . CAD (coronary artery disease)     a. 10/2012:  inferolateral wall ST elevation MI s/p stent to distal LCx 10/29/12 (Stentys Apposition Trial randomizing him to a self-expanding bare-metal stent versus a traditional balloon expandable bare-metal coronary stent).   . Hyperlipidemia   . Leukocytosis   . Polycythemia     a. patient reports hx of therapeutic phlebotomy in the 1970s => HCT eventually stabilized and he no longer required phlebotomy    ROS: Negative except as per HPI  BP 141/78  Pulse 90  Ht 6\' 1"  (1.854 m)  Wt 87.544 kg (193 lb)  BMI 25.47 kg/m2  PHYSICAL EXAM: Pt is alert and oriented, NAD HEENT: normal Neck: JVP - normal, carotids 2+= without bruits Lungs: CTA bilaterally with prolonged expiratory phase CV: RRR without murmur or gallop Abd: soft, NT, Positive BS, no hepatomegaly Ext: no C/C/E, distal pulses intact and equal Skin: warm/dry no rash  EKG:  Normal sinus rhythm 90 beats per minute, within normal limits.  ASSESSMENT AND PLAN: 1. Coronary artery disease, native vessel. The patient is stable without anginal symptoms. He has discontinued effient and remains on aspirin 81 mg daily. Other medications are appropriate with atorvastatin, carvedilol, and lisinopril. I would like to see him back in one year.  2. Hypertension. Blood pressure well controlled on carvedilol and lisinopril.  Advised to stay hydrated and use caution with positional changes.  3. Hyperlipidemia. The patient is on a high intensity statin drug with atorvastatin 80 mg daily. Recent labs were reviewed and LFTs are normal. Total cholesterol is 128, triglycerides 160, HDL 30, and LDL 66.  4. Type 2 diabetes. He's  on an ACE inhibitor in the setting of his CAD. Diabetes management per Dr Moreen Fowler.   Sherren Mocha 02/10/2014 11:21 PM

## 2014-02-21 ENCOUNTER — Other Ambulatory Visit (HOSPITAL_COMMUNITY): Payer: Self-pay | Admitting: Cardiovascular Disease

## 2014-05-10 IMAGING — CR DG CHEST 1V PORT
2 series · 2 of 2 positions shown · non-contrast
Comparison: None.

CLINICAL DATA: Post cardiac cath.  Myocardial infarction

PORTABLE CHEST - 1 VIEW

[AP (1 of 2)]
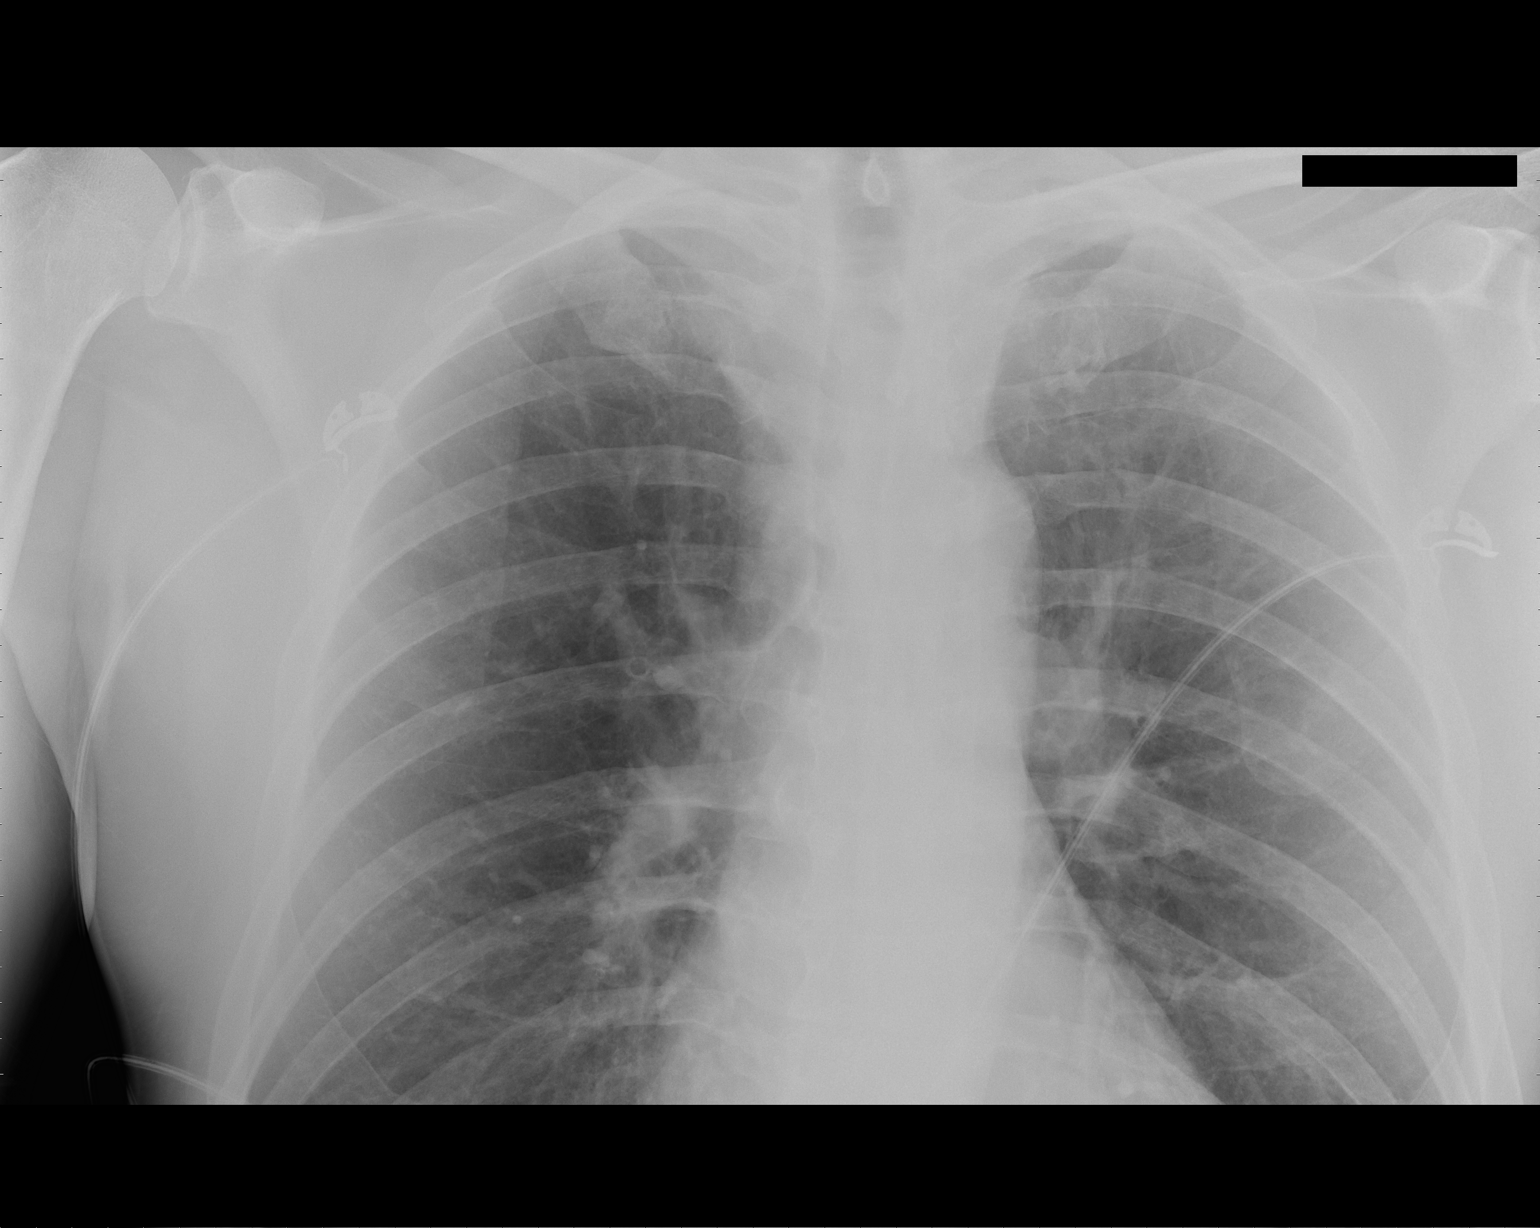

[AP (2 of 2)]
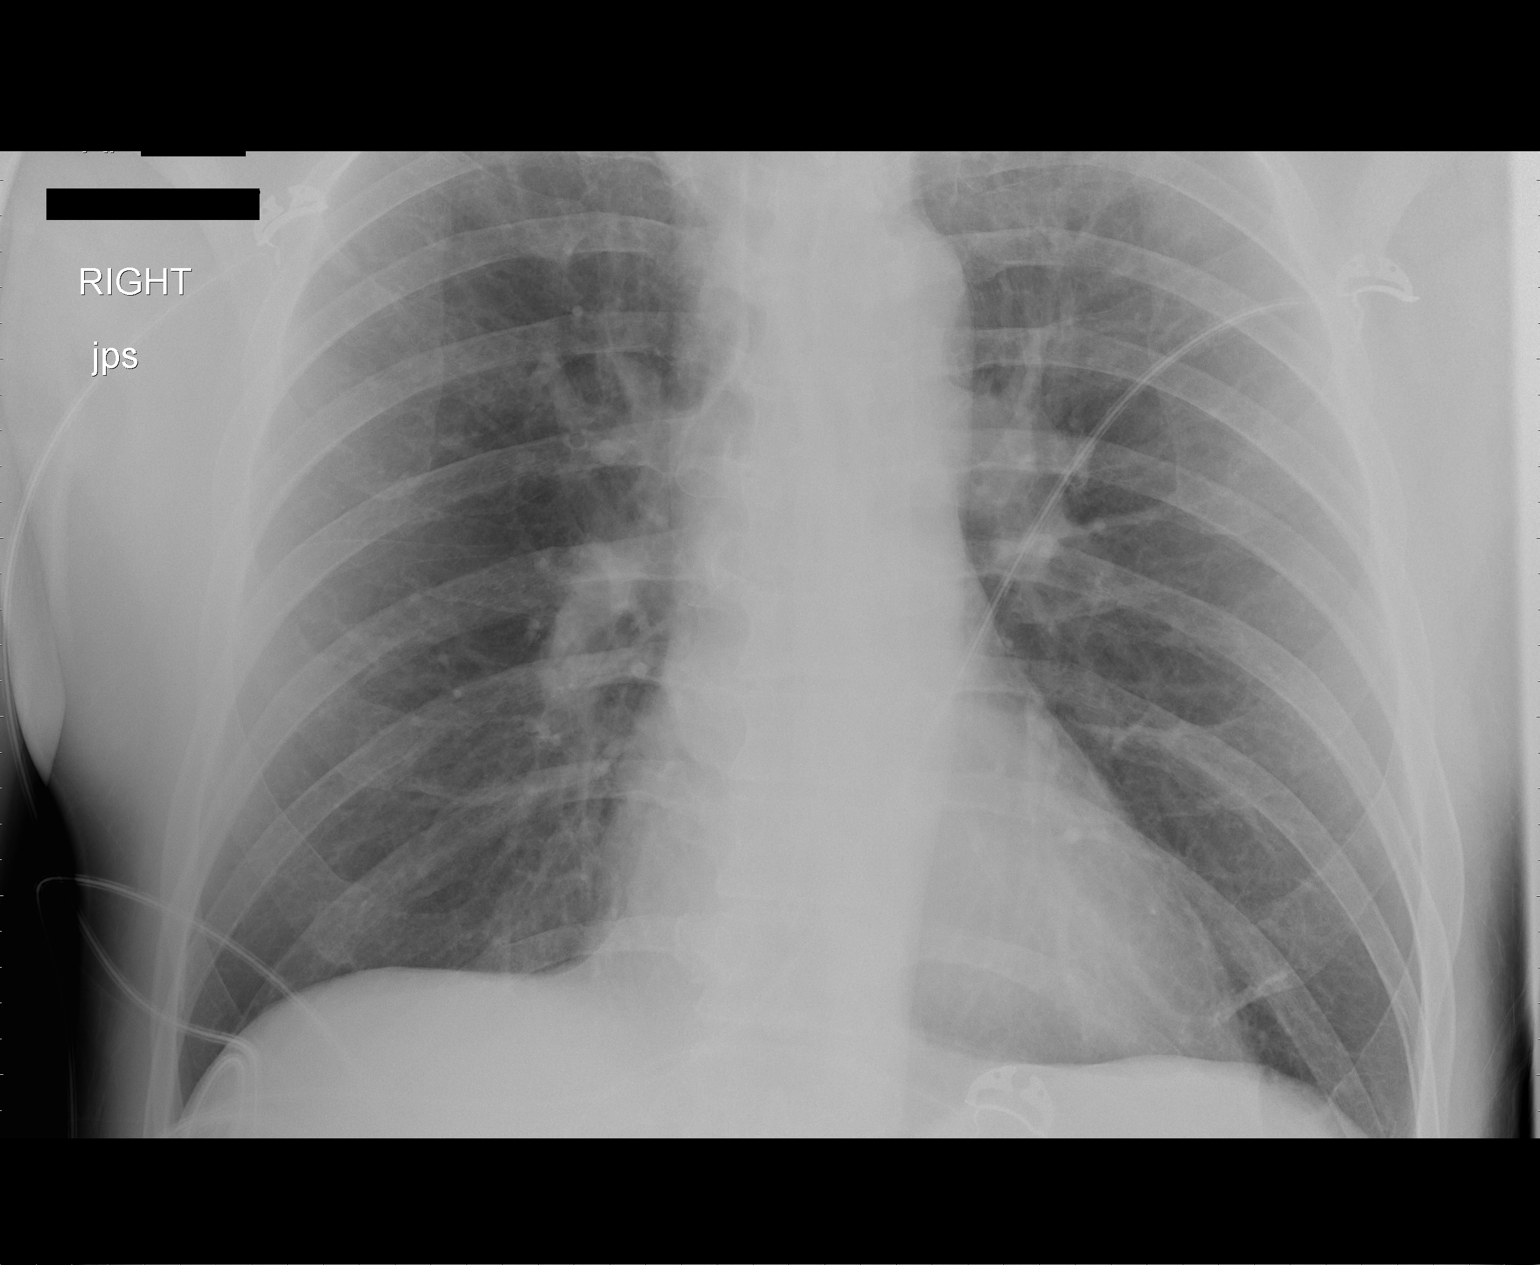

[2 of 2 positions shown; findings below may reference images not displayed]

FINDINGS: Negative for heart failure or effusion.  Lungs are clear.
Heart size is within normal limits.
IMPRESSION: Negative

## 2014-06-16 ENCOUNTER — Ambulatory Visit (INDEPENDENT_AMBULATORY_CARE_PROVIDER_SITE_OTHER): Payer: Commercial Managed Care - PPO | Admitting: Medical

## 2014-06-16 ENCOUNTER — Encounter: Payer: Self-pay | Admitting: Medical

## 2014-06-16 VITALS — BP 110/70 | HR 76 | Temp 98.0°F | Resp 17 | Ht 73.0 in | Wt 189.0 lb

## 2014-06-16 DIAGNOSIS — I252 Old myocardial infarction: Secondary | ICD-10-CM

## 2014-06-16 DIAGNOSIS — E119 Type 2 diabetes mellitus without complications: Secondary | ICD-10-CM

## 2014-06-16 DIAGNOSIS — J3089 Other allergic rhinitis: Secondary | ICD-10-CM

## 2014-06-16 DIAGNOSIS — E785 Hyperlipidemia, unspecified: Secondary | ICD-10-CM

## 2014-06-16 DIAGNOSIS — I10 Essential (primary) hypertension: Secondary | ICD-10-CM

## 2014-06-16 LAB — POCT GLYCOSYLATED HEMOGLOBIN (HGB A1C): Hemoglobin A1C: 6.5

## 2014-06-16 MED ORDER — AZELASTINE-FLUTICASONE 137-50 MCG/ACT NA SUSP: 1.0000 | Freq: Every day | NASAL | Status: DC

## 2014-06-16 NOTE — Patient Instructions (Signed)
Thank you for giving me the opportunity to serve you today.    Your diagnosis today includes: Encounter Diagnoses  Name Primary?  . Diabetes type 2, controlled Yes  . Essential hypertension   . History of MI (myocardial infarction)   . Hyperlipidemia   . Other allergic rhinitis      Specific recommendations today include:  Take the Atorvastatin 80mg  cholesterol medication and Aspirin 81 mg at bedtime daily for cholesterol  Take metformin 500 mg twice daily for diabetes  Take Coreg twice daily for blood pressure and heart disease   take lisinopril 5 mg daily in the morning for blood pressure and kidney protection  Nitroglycerin as needed  Xanax as per instructions by your psychiatrist   Diabetes Meal Planning Guide The diabetes meal planning guide is a tool to help you plan your meals and snacks. It is important for people with diabetes to manage their blood glucose (sugar) levels. Choosing the right foods and the right amounts throughout your day will help control your blood glucose. Eating right can even help you improve your blood pressure and reach or maintain a healthy weight.  CARBOHYDRATE COUNTING MADE EASY When you eat carbohydrates, they turn to sugar. This raises your blood glucose level. Counting carbohydrates can help you control this level so you feel better. When you plan your meals by counting carbohydrates, you can have more flexibility in what you eat and balance your medicine with your food intake.  Carbohydrate counting simply means adding up the total amount of carbohydrate grams in your meals and snacks. Try to eat about the same amount at each meal. Foods with carbohydrates are listed below.   Each portion below is 1 carbohydrate serving or 15 grams of carbohydrates.    1800 Calorie Diet for Diabetes Meal Planning The 1800 calorie diet is designed for eating up to 1800 calories each day. Following this diet and making healthy meal choices can help  improve overall health. This diet controls blood sugar (glucose) levels and can also help lower blood pressure and cholesterol.  SERVING SIZES Measuring foods and serving sizes helps to make sure you are getting the right amount of food. The list below tells how big or small some common serving sizes are:  1 oz.........4 stacked dice.  3 oz........Marland KitchenDeck of cards.  1 tsp.......Marland KitchenTip of little finger.  1 tbs......Marland KitchenMarland KitchenThumb.  2 tbs.......Marland KitchenGolf ball.   cup......Marland KitchenHalf of a fist.  1 cup.......Marland KitchenA fist.   GUIDELINES FOR CHOOSING FOODS The goal of this diet is to eat a variety of foods and limit calories to 1800 each day. This can be done by choosing foods that are low in calories and fat. The diet also suggests eating small amounts of food frequently. Doing this helps control your blood glucose levels so they do not get too high or too low. Each meal or snack may include a protein food source to help you feel more satisfied and to stabilize your blood glucose. Try to eat about the same amount of food around the same time each day. This includes weekend days, travel days, and days off work. Space your meals about 4 to 5 hours apart and add a snack between them if you wish.  For example, a daily food plan could include breakfast, a morning snack, lunch, dinner, and an evening snack. Healthy meals and snacks include whole grains, vegetables, fruits, lean meats, poultry, fish, and dairy products. As you plan your meals, select a variety of foods. Choose from the bread  and starch, vegetable, fruit, dairy, and meat/protein groups. Examples of foods from each group and their suggested serving sizes are listed below. Use measuring cups and spoons to become familiar with what a healthy portion looks like. Bread and Starch Each serving equals 15 grams of carbohydrates.  1 slice bread.   bagel.   cup cold cereal (unsweetened).   cup hot cereal or mashed potatoes.  1 small potato (size of a computer  mouse).   cup cooked pasta or rice.   English muffin.  1 cup broth-based soup.  3 cups of popcorn.  4 to 6 whole-wheat crackers.   cup cooked beans, peas, or corn. Vegetable Each serving equals 5 grams of carbohydrates.   cup cooked vegetables.  1 cup raw vegetables.   cup tomato or vegetable juice. Fruit Each serving equals 15 grams of carbohydrates.  1 small apple or orange.  1 cup watermelon or strawberries.   cup applesauce (no sugar added).  2 tbs raisins.   banana.   cup canned fruit, packed in water, its own juice, or sweetened with a sugar substitute.   cup unsweetened fruit juice. Dairy Each serving equals 12 to 15 grams of carbohydrates.  1 cup fat-free milk.  6 oz artificially sweetened yogurt or plain yogurt.  1 cup low-fat buttermilk.  1 cup soy milk.  1 cup almond milk. Meat/Protein  1 large egg.  2 to 3 oz meat, poultry, or fish.   cup low-fat cottage cheese.  1 tbs peanut butter.  1 oz low-fat cheese.   cup tuna in water.   cup tofu. Fat  1 tsp oil.  1 tsp trans-fat-free margarine.  1 tsp butter.  1 tsp mayonnaise.  2 tbs avocado.  1 tbs salad dressing.  1 tbs cream cheese.  2 tbs sour cream.  SAMPLE 1800 CALORIE DIET PLAN Breakfast   cup unsweetened cereal (1 carb serving).  1 cup fat-free milk (1 carb serving).  1 slice whole-wheat toast (1 carb serving).   small banana (1 carb serving).  1 scrambled egg.  1 tsp trans-fat-free margarine. Lunch  Tuna sandwich.  2 slices whole-wheat bread (2 carb servings).   cup canned tuna in water, drained.  1 tbs reduced fat mayonnaise.  1 stalk celery, chopped.  2 slices tomato.  1 lettuce leaf.  1 cup carrot sticks.  24 to 30 seedless grapes (2 carb servings).  6 oz light yogurt (1 carb serving). Afternoon Snack  3 graham cracker squares (1 carb serving).  Fat-free milk, 1 cup (1 carb serving).  1 tbs peanut  butter. Dinner  3 oz salmon, broiled with 1 tsp oil.  1 cup mashed potatoes (2 carb servings) with 1 tsp trans-fat-free margarine.  1 cup fresh or frozen green beans.  1 cup steamed asparagus.  1 cup fat-free milk (1 carb serving). Evening Snack  3 cups air-popped popcorn (1 carb serving).  2 tbs parmesan cheese sprinkled on top.  MEAL PLAN Use this worksheet to help you make a daily meal plan based on the 1800 calorie diet suggestions. If you are using this plan to help you control your blood glucose, you may interchange carbohydrate-containing foods (dairy, starches, and fruits). Select a variety of fresh foods of varying colors and flavors. The total amount of carbohydrate in your meals or snacks is more important than making sure you include all of the food groups every time you eat. Choose from the following foods to build your day's meals:  8 Starches.  4 Vegetables.  3 Fruits.  2 Dairy.  6 to 7 oz Meat/Protein.  Up to 4 Fats.  Your dietician can use this worksheet to help you decide how many servings and which types of foods are right for you. BREAKFAST Food Group and Servings / Food Choice Starch ________________________________________________________ Dairy _________________________________________________________ Fruit _________________________________________________________ Meat/Protein __________________________________________________ Fat ___________________________________________________________ LUNCH Food Group and Servings / Food Choice Starch ________________________________________________________ Meat/Protein __________________________________________________ Vegetable _____________________________________________________ Fruit _________________________________________________________ Dairy _________________________________________________________ Fat ___________________________________________________________ Wilhemina Bonito Food Group and  Servings / Food Choice Starch ________________________________________________________ Meat/Protein __________________________________________________ Fruit __________________________________________________________ Dairy _________________________________________________________ Wonda Cheng Food Group and Servings / Food Choice Starch _________________________________________________________ Meat/Protein ___________________________________________________ Dairy __________________________________________________________ Vegetable ______________________________________________________ Fruit ___________________________________________________________ Fat ____________________________________________________________ Fermin Schwab Food Group and Servings / Food Choice Fruit __________________________________________________________ Meat/Protein ___________________________________________________ Dairy __________________________________________________________ Starch _________________________________________________________ DAILY TOTALS Starch ____________________________ Vegetable _________________________ Fruit _____________________________ Dairy _____________________________ Meat/Protein______________________ Fat _______________________________ Document Released: 03/04/2005 Document Revised: 11/04/2011 Document Reviewed: 06/28/2011 [employer] Patient Information 2013 [place], [place].

## 2014-06-16 NOTE — Progress Notes (Signed)
   Subjective:   Daniel Lucas is a 58 y.o. male presenting on 06/16/2014 with needs to discuss getting established and rx refills  Medical team: Dr. Sherren Lucas cardiology Dr. Nevada Lucas, nephrology in [facility] Establishing here today with me Daniel Ogle PA Sees ophthalmology, last visit within 12 months  Here as a new patient today would like to change to a different primary care provider  He is a type II diabetic, was just diagnosed in the last few years, takes metformin 500 mg twice daily. He is checking glucose and getting numbers consistently in the 120 or less range. Overall he doesn't feel like he's gotten clear information about diabetes disease process and what to do about.  He feels like he is eating healthy although he does eat potatoes, cheeseburgers at [employer] occasionally, a variety of fruits and vegetables.  He is compliant with all of his medications.  His main complaint today is to establish care and to get something for year-round nonstop runny nose and sneezing.  Otherwise been in usual state of health  Sees cardiology, had a heart attack last year, is now on yearly followup with cardiology  No other complaint.  Review of Systems ROS as in subjective      Objective:    Filed Vitals:   06/16/14 0921  BP: 110/70  Pulse: 76  Temp: 98 F (36.7 C)  Resp: 17    General appearance: alert, no distress, WD/WN HEENT: normocephalic, sclerae anicteric, TMs pearly, nares with turbinate edema, clear discharge, pharynx normal Oral cavity: MMM, no lesions Neck: supple, no lymphadenopathy, no thyromegaly, no masses Heart: RRR, normal S1, S2, no murmurs Lungs: CTA bilaterally, no wheezes, rhonchi, or rales Abdomen: +bs, soft, non tender, non distended, no masses, no hepatomegaly, no splenomegaly Pulses: 2+ symmetric, upper and lower extremities, normal cap refill Ext: no edema      Assessment: Encounter Diagnoses  Name Primary?  . Diabetes  type 2, controlled Yes  . Essential hypertension   . History of MI (myocardial infarction)   . Hyperlipidemia   . Other allergic rhinitis      Plan: Diabetes type 2-discussed the disease process, possible complications, seeing ophthalmology yearly, checking feet daily, discussed medications, discussed diet in great detail. Hemoglobin A1c 6.5% today, continue metformin 500 twice daily  Hypertension-controlled on current medication  History of heart attack, cardiovascular disease-reviewed his most recent cardiac notes from Dr. Burt Lucas in the chart  Hyperlipidemia-compliant with medication, reviewed his labs from May 2015   Allergy rhinitis-gave samples of Dymista nasal spray and Nasonex nasal spray as trials, discussed proper use. Advise he call back in 2 weeks to let me know how the nasal spray is doing  Otherwise at this point given the lab results I have and the good control with medications followup in the spring, sooner as needed  He will be getting flu shot at wife's job.  [name] was seen today for needs to discuss getting established and rx refills.  Diagnoses and associated orders for this visit:  Diabetes type 2, controlled  Essential hypertension  History of MI (myocardial infarction)  Hyperlipidemia  Other allergic rhinitis    Return in about 3 months (around 09/16/2014).

## 2014-06-24 ENCOUNTER — Encounter: Payer: Self-pay | Admitting: Medical

## 2014-07-01 ENCOUNTER — Telehealth: Payer: Self-pay | Admitting: Family Medicine

## 2014-07-01 NOTE — Telephone Encounter (Signed)
Records received from former doc. Sending back for review please note what needs to scanned or abstracted.

## 2014-07-01 NOTE — Telephone Encounter (Signed)
Done, nothing to be scanned

## 2014-07-15 ENCOUNTER — Other Ambulatory Visit (HOSPITAL_COMMUNITY): Payer: Self-pay | Admitting: Cardiovascular Disease

## 2014-08-03 ENCOUNTER — Encounter: Payer: Self-pay | Admitting: Internal Medicine

## 2014-08-04 ENCOUNTER — Encounter (HOSPITAL_COMMUNITY): Payer: Self-pay | Admitting: Cardiovascular Disease

## 2014-08-21 ENCOUNTER — Other Ambulatory Visit: Payer: Self-pay | Admitting: Medical

## 2014-11-07 ENCOUNTER — Encounter: Payer: Self-pay | Admitting: Medical

## 2014-11-07 ENCOUNTER — Ambulatory Visit (INDEPENDENT_AMBULATORY_CARE_PROVIDER_SITE_OTHER): Payer: Commercial Managed Care - PPO | Admitting: Medical

## 2014-11-07 ENCOUNTER — Encounter (INDEPENDENT_AMBULATORY_CARE_PROVIDER_SITE_OTHER): Payer: Commercial Managed Care - PPO | Admitting: Medical

## 2014-11-07 VITALS — BP 120/80 | HR 74 | Temp 98.3°F | Resp 14 | Ht 73.0 in | Wt 194.6 lb

## 2014-11-07 DIAGNOSIS — D751 Secondary polycythemia: Secondary | ICD-10-CM

## 2014-11-07 DIAGNOSIS — Z87442 Personal history of urinary calculi: Secondary | ICD-10-CM | POA: Diagnosis not present

## 2014-11-07 DIAGNOSIS — E785 Hyperlipidemia, unspecified: Secondary | ICD-10-CM | POA: Diagnosis not present

## 2014-11-07 DIAGNOSIS — Z7185 Encounter for immunization safety counseling: Secondary | ICD-10-CM

## 2014-11-07 DIAGNOSIS — E119 Type 2 diabetes mellitus without complications: Secondary | ICD-10-CM | POA: Diagnosis not present

## 2014-11-07 DIAGNOSIS — Z7189 Other specified counseling: Secondary | ICD-10-CM | POA: Diagnosis not present

## 2014-11-07 DIAGNOSIS — I10 Essential (primary) hypertension: Secondary | ICD-10-CM

## 2014-11-07 DIAGNOSIS — I2510 Atherosclerotic heart disease of native coronary artery without angina pectoris: Secondary | ICD-10-CM

## 2014-11-07 LAB — COMPREHENSIVE METABOLIC PANEL
ALT: 17 U/L (ref 0–53)
AST: 17 U/L (ref 0–37)
Albumin: 4.5 g/dL (ref 3.5–5.2)
Alkaline Phosphatase: 40 U/L (ref 39–117)
BILIRUBIN TOTAL: 0.6 mg/dL (ref 0.2–1.2)
BUN: 12 mg/dL (ref 6–23)
CO2: 25 meq/L (ref 19–32)
CREATININE: 0.83 mg/dL (ref 0.50–1.35)
Calcium: 9.4 mg/dL (ref 8.4–10.5)
Chloride: 105 mEq/L (ref 96–112)
GLUCOSE: 142 mg/dL — AB (ref 70–99)
Potassium: 4.5 mEq/L (ref 3.5–5.3)
Sodium: 140 mEq/L (ref 135–145)
Total Protein: 6.6 g/dL (ref 6.0–8.3)

## 2014-11-07 LAB — CBC
HEMATOCRIT: 43.5 % (ref 39.0–52.0)
HEMOGLOBIN: 14.5 g/dL (ref 13.0–17.0)
MCH: 30.8 pg (ref 26.0–34.0)
MCHC: 33.3 g/dL (ref 30.0–36.0)
MCV: 92.4 fL (ref 78.0–100.0)
MPV: 10.3 fL (ref 8.6–12.4)
Platelets: 311 10*3/uL (ref 150–400)
RBC: 4.71 MIL/uL (ref 4.22–5.81)
RDW: 13.8 % (ref 11.5–15.5)
WBC: 9.3 10*3/uL (ref 4.0–10.5)

## 2014-11-07 LAB — LIPID PANEL
CHOLESTEROL: 129 mg/dL (ref 0–200)
HDL: 32 mg/dL — AB (ref >=40)
LDL CALC: 73 mg/dL (ref 0–99)
TRIGLYCERIDES: 118 mg/dL (ref <150)
Total CHOL/HDL Ratio: 4 Ratio
VLDL: 24 mg/dL (ref 0–40)

## 2014-11-07 LAB — POCT GLYCOSYLATED HEMOGLOBIN (HGB A1C): Hemoglobin A1C: 6.7

## 2014-11-07 NOTE — Progress Notes (Signed)
  Subjective:   Daniel Lucas is an 59 y.o. male who presents for follow up of Type 2 diabetes mellitus and chronic issues.   Patient is checking home blood sugars.   Home blood sugar records: 115 to 150 Current symptoms include: none. Patient denies no concerns.  Patient is checking their feet daily. Foot concerns (callous, ulcer, wound, thickened nails, toenail fungus, skin fungus, hammer toe): no concerns Last dilated eye exam 2 years ago  Current treatments: metformin 500mg  BID Medication compliance: good  Current diet: well balanced Current exercise: walking Known diabetic complications: none  Here for follow-up of hypertension. Compliant with medication, coreg 6.25mg  BID and lisinopril 5mg  daily  Here for f/u on hyperlipidemia - compliant, no c/o, taking Lipitor 80mg  and ASA 81mg  QHS  Hx/o polycythemia but phlebotomy  and hematology f/u few years ago  Last eye doctor visit little over a year ago  Last nephrology visit few years ago for renal stones  Hx/o MI.  Sees cardiology again in 01/2015.   The following portions of the patient's history were reviewed and updated as appropriate: allergies, current medications, past family history, past medical history, past social history, past surgical history and problem list.  ROS as in subjective above    Objective:   Filed Vitals:   11/07/14 0826  BP: 120/80  Pulse: 74  Temp: 98.3 F (36.8 C)  Resp: 14    General appearance: alert, no distress, WD/WN, white male HEENT: normocephalic, sclerae anicteric, TMs pearly, nares patent, no discharge or erythema, pharynx normal Oral cavity: MMM, no lesions Neck: supple, no lymphadenopathy, no thyromegaly, no masses Heart: RRR, normal S1, S2, no murmurs Lungs: CTA bilaterally, no wheezes, rhonchi, or rales Abdomen: +bs, soft, non tender, non distended, no masses, no hepatomegaly, no splenomegaly Pulses: 2+ symmetric, upper and lower extremities, normal cap refill See separate  foot exam   Assessment:   Encounter Diagnoses  Name Primary?  . Diabetes type 2, controlled Yes  . Hyperlipidemia   . Essential hypertension   . Polycythemia   . CAD in native artery   . History of kidney stones   . Vaccine counseling      Plan:   Diabetes type 2 - hgb A1C 6.7% today.   C/t metformin 500mg  BID, health diet, get back to exercising regularly, daily foot checks, and eye doctor visit yearly HTN - c/t current medication, labs today Hyperlipidemia - c/t current medication, labs today Polycythemia - hx/o, labs today CAD - f/u with cardiology in June Hx/o renal stones Vaccine counseling - he is up to date on tdap, pneumococcal, and flu.  Advised shingles vaccine.  He will check insurance coverage.   Check insurance coverage for screening colonoscopy as well.  He has never had one.

## 2014-11-08 ENCOUNTER — Telehealth: Payer: Self-pay | Admitting: Cardiovascular Disease

## 2014-11-08 ENCOUNTER — Other Ambulatory Visit: Payer: Self-pay | Admitting: Medical

## 2014-11-08 ENCOUNTER — Telehealth: Payer: Self-pay | Admitting: Medical

## 2014-11-08 MED ORDER — NIACIN ER (ANTIHYPERLIPIDEMIC) 500 MG PO TBCR: 500.0000 mg | EXTENDED_RELEASE_TABLET | Freq: Every day | ORAL | Status: DC

## 2014-11-08 MED ORDER — AZELASTINE-FLUTICASONE 137-50 MCG/ACT NA SUSP: 1.0000 | Freq: Every day | NASAL | Status: DC

## 2014-11-08 MED ORDER — LISINOPRIL 5 MG PO TABS: 5.0000 mg | ORAL_TABLET | Freq: Every day | ORAL | Status: DC

## 2014-11-08 MED ORDER — ATORVASTATIN CALCIUM 80 MG PO TABS: 80.0000 mg | ORAL_TABLET | Freq: Every day | ORAL | Status: DC

## 2014-11-08 MED ORDER — METFORMIN HCL 500 MG PO TABS: 500.0000 mg | ORAL_TABLET | Freq: Two times a day (BID) | ORAL | Status: DC

## 2014-11-08 NOTE — Telephone Encounter (Signed)
I spoke with Pearson Grippe at [facility] and [employer] PA did not recommend changing any of the pt's current medications.  He did start the pt on Niacin due to HDL 32. A copy of the pt's lab work has been faxed to our office for our records.

## 2014-11-08 NOTE — Telephone Encounter (Signed)
New Msg       Pt calling states new PCP is attempting to change some of his medication.   Dr. Chana Bode is the physician and office number is [phone].  Pt isn't sure of name of medication and wants this office contacted because he is afraid to take anything Dr. Burt Knack hasn't prescribed.   Please call office, then pt per pt request.

## 2014-11-08 NOTE — Telephone Encounter (Signed)
I called and I spoke with lauren Dr. Antionette Char nurse and I explain to her that the only medication change that [name] tysinger PA made was that he added on Niacin to his medication due to his low HDL and being a rick factor for heart diease. She states that is just fine and she would call the patient and go over it with him.

## 2014-11-08 NOTE — Telephone Encounter (Signed)
I spoke with the pt and made him aware that he is okay to start Niaspan CR 500mg  at bedtime.  I advised the pt of possible side effect (flushing) and he will change his daily dose of Aspirin to 30 minutes prior to Niaspan dosage. The pt will contact the office with any other questions or concerns.

## 2014-11-08 NOTE — Telephone Encounter (Signed)
I spoke with the office staff and they will have a nurse call me back in regards to the pt's care.

## 2014-11-08 NOTE — Telephone Encounter (Signed)
[name], Dr. Antionette Char nurse from [facility] called to get some clarification and/or more info regarding pt. Pt advise their office that [name] changed some meds and pt wanted to get the med changes cleared with [name] prior to starting them. [name] requesting a call from [name] to clarify the changes

## 2015-01-31 ENCOUNTER — Ambulatory Visit (INDEPENDENT_AMBULATORY_CARE_PROVIDER_SITE_OTHER): Payer: Commercial Managed Care - PPO | Admitting: Medical

## 2015-01-31 ENCOUNTER — Encounter: Payer: Self-pay | Admitting: Medical

## 2015-01-31 VITALS — BP 100/70 | HR 74 | Temp 98.1°F | Resp 14 | Wt 197.0 lb

## 2015-01-31 DIAGNOSIS — R05 Cough: Secondary | ICD-10-CM

## 2015-01-31 DIAGNOSIS — R059 Cough, unspecified: Secondary | ICD-10-CM

## 2015-01-31 DIAGNOSIS — J0100 Acute maxillary sinusitis, unspecified: Secondary | ICD-10-CM | POA: Diagnosis not present

## 2015-01-31 MED ORDER — NIACIN ER (ANTIHYPERLIPIDEMIC) 500 MG PO TBCR: 500.0000 mg | EXTENDED_RELEASE_TABLET | Freq: Every day | ORAL | Status: DC

## 2015-01-31 MED ORDER — AMOXICILLIN 875 MG PO TABS: 875.0000 mg | ORAL_TABLET | Freq: Two times a day (BID) | ORAL | Status: DC

## 2015-01-31 MED ORDER — FLUTICASONE PROPIONATE 50 MCG/ACT NA SUSP: 2.0000 | Freq: Every day | NASAL | Status: DC

## 2015-01-31 NOTE — Patient Instructions (Signed)
Encounter Diagnoses  Name Primary?  . Acute maxillary sinusitis, recurrence not specified Yes  . Cough     Recommendations  Rest  Hydrate well with water  continue Claritin daily for allergies or changes to Zyrtec or Allegra OTC  Begin Flonase nasal spray for allergies  Begin Amoxicillin twice daily for 10 days for sinus infection  Begin OTC Coricidin HBP for cough/congestion  If not much improved in the next 5-7 days, call back

## 2015-01-31 NOTE — Progress Notes (Signed)
Subjective:  Daniel Lucas is a 60 y.o. male who presents for possible sinus infection.  Symptoms include 1+ week of sinus presure, head congestion, some dizziness this morning, cough, and chest congestion, sore throat.  Took some robittusin today made him a little nausea.  Denies fever, wheezing, no vomiting, no diarrhea.  Non smoker..  +sick contacts,wife with sinus and ear infection.  No other aggravating or relieving factors.  No other c/o.  ROS as in subjective   Objective: Filed Vitals:   01/31/15 0916  BP: 100/70  Pulse: 74  Temp: 98.1 F (36.7 C)  Resp: 14    General appearance: Alert, WD/WN, no distress                             Skin: warm, no rash                           Head: + maxillary sinus tenderness,                            Eyes: conjunctiva normal, corneas clear, PERRLA                            Ears: pearly TMs, external ear canals normal                          Nose: septum midline, turbinates swollen, with erythema and clear discharge             Mouth/throat: MMM, tongue normal, mild pharyngeal erythema                           Neck: supple, no adenopathy, no thyromegaly, nontender                         Lungs: CTA bilaterally, no wheezes, rales, or rhonchi      Assessment and Plan:  Encounter Diagnoses  Name Primary?  . Acute maxillary sinusitis, recurrence not specified Yes  . Cough    Patient Instructions   Encounter Diagnoses  Name Primary?  . Acute maxillary sinusitis, recurrence not specified Yes  . Cough     Recommendations  Rest  Hydrate well with water  continue Claritin daily for allergies or changes to Zyrtec or Allegra OTC  Begin Flonase nasal spray for allergies  Begin Amoxicillin twice daily for 10 days for sinus infection  Begin OTC Coricidin HBP for cough/congestion  If not much improved in the next 5-7 days, call back   f/u soon for med check and fasting labs

## 2015-02-09 ENCOUNTER — Encounter: Payer: Self-pay | Admitting: *Deleted

## 2015-02-13 ENCOUNTER — Encounter: Payer: Self-pay | Admitting: Cardiovascular Disease

## 2015-02-13 ENCOUNTER — Ambulatory Visit (INDEPENDENT_AMBULATORY_CARE_PROVIDER_SITE_OTHER): Payer: Commercial Managed Care - PPO | Admitting: Cardiovascular Disease

## 2015-02-13 VITALS — BP 132/74 | HR 81 | Ht 73.0 in | Wt 198.0 lb

## 2015-02-13 DIAGNOSIS — I10 Essential (primary) hypertension: Secondary | ICD-10-CM | POA: Diagnosis not present

## 2015-02-13 DIAGNOSIS — I2510 Atherosclerotic heart disease of native coronary artery without angina pectoris: Secondary | ICD-10-CM | POA: Diagnosis not present

## 2015-02-13 NOTE — Patient Instructions (Signed)

## 2015-02-13 NOTE — Progress Notes (Signed)
Cardiology Office Note Date:  02/13/2015   ID:  [name], DOB 08-25-1956, MRN [id number]  PCP:  Crisoforo Oxford, PA-C  Cardiologist:  Sherren Mocha, MD    Chief Complaint  Patient presents with  . Coronary Artery Disease    History of Present Illness: Daniel Lucas is a 59 y.o. male who presents for follow-up evaluation.  The patient has coronary artery disease and he presented in March 2014 with an inferior wall STEMI. The patient has diabetes, hypertension, and history long-standing tobacco abuse. Cardiac catheterization demonstrated multivessel disease, but his left circumflex was the culprit vessel with acute total occlusion. He was treated with a bare-metal stent platform through the Stentys Apposition Trial. His post myocardial infarction left ventricular function was well-preserved with the exception of inferobasal hypokinesis. The LVEF was 55%.   He is doing well. He has been able to stay off of cigarettes completely. He does note that he has a "short fuse." He takes and neck about 4 times per day as needed. He's had no chest pain, chest pressure, shortness of breath, or leg swelling.   Past Medical History  Diagnosis Date  . Hypertension   . Diabetes mellitus   . CAD (coronary artery disease)     a. 10/2012: inferolateral wall ST elevation MI s/p stent to distal LCx 10/29/12 (Stentys Apposition Trial randomizing him to a self-expanding bare-metal stent versus a traditional balloon expandable bare-metal coronary stent).   . Hyperlipidemia   . Leukocytosis   . Polycythemia     a. patient reports hx of therapeutic phlebotomy in the 1970s => HCT eventually stabilized and he no longer required phlebotomy  . Former smoker   . Anxiety     Past Surgical History  Procedure Laterality Date  . None         . Left heart catheterization with coronary angiogram N/A 10/29/2012    Procedure: LEFT HEART CATHETERIZATION WITH CORONARY ANGIOGRAM;  Surgeon: Sherren Mocha, MD;   Location: [facility] CATH LAB;  Service: Cardiovascular;  Laterality: N/A;    Current Outpatient Prescriptions  Medication Sig Dispense Refill  . ALPRAZolam (XANAX) 0.5 MG tablet Take 0.5 mg by mouth 4 (four) times daily as needed. For panic attacks    . aspirin 81 MG tablet Take 1 tablet (81 mg total) by mouth daily.    Marland Kitchen atorvastatin (LIPITOR) 80 MG tablet Take 1 tablet (80 mg total) by mouth daily. 90 tablet 1  . carvedilol (COREG) 6.25 MG tablet TAKE 1 TABLET TWICE A DAY WITH FOOD 60 tablet 5  . fluticasone (FLONASE) 50 MCG/ACT nasal spray Place 2 sprays into both nostrils daily. 16 g 5  . lisinopril (PRINIVIL,ZESTRIL) 5 MG tablet Take 1 tablet (5 mg total) by mouth daily. 90 tablet 3  . metFORMIN (GLUCOPHAGE) 500 MG tablet Take 1 tablet (500 mg total) by mouth 2 (two) times daily with a meal. 180 tablet 3  . niacin (NIASPAN) 500 MG CR tablet Take 1 tablet (500 mg total) by mouth at bedtime. 30 tablet 2  . nitroGLYCERIN (NITROSTAT) 0.4 MG SL tablet Place 1 tablet (0.4 mg total) under the tongue every 5 (five) minutes as needed for chest pain (up to 3 doses). 25 tablet 4   No current facility-administered medications for this visit.    Allergies:   Review of patient's allergies indicates no known allergies.   Social History:  The patient  reports that he quit smoking about 2 years ago. He does not have any  smokeless tobacco history on file. He reports that he does not drink alcohol or use illicit drugs.   Family History:  The patient's  He was adopted. Family history is unknown by patient.    ROS:  Please see the history of present illness.  Otherwise, review of systems is positive for depression, anxiety, and easy bruising.  All other systems are reviewed and negative.    PHYSICAL EXAM: VS:  BP 132/74 mmHg  Pulse 81  Ht 6\' 1"  (1.854 m)  Wt 198 lb (89.812 kg)  BMI 26.13 kg/m2 , BMI Body mass index is 26.13 kg/(m^2). GEN: Well nourished, well developed, in no acute distress HEENT:  normal Neck: no JVD, no masses. No carotid bruits Cardiac: RRR without murmur or gallop                Respiratory:  clear to auscultation bilaterally, normal work of breathing GI: soft, nontender, nondistended, + BS MS: no deformity or atrophy Ext: no pretibial edema, pedal pulses 2+= bilaterally Skin: warm and dry, no rash Neuro:  Strength and sensation are intact Psych: euthymic mood, full affect  EKG:  EKG is ordered today. The ekg ordered today shows NSR 81 bpm, within normal limits  Recent Labs: 11/07/2014: ALT 17; BUN 12; Creat 0.83; Hemoglobin 14.5; Platelets 311; Potassium 4.5; Sodium 140   Lipid Panel     Component Value Date/Time   CHOL 129 11/07/2014 0858   TRIG 118 11/07/2014 0858   HDL 32* 11/07/2014 0858   CHOLHDL 4.0 11/07/2014 0858   VLDL 24 11/07/2014 0858   LDLCALC 73 11/07/2014 0858     Wt Readings from Last 3 Encounters:  02/13/15 198 lb (89.812 kg)  01/31/15 197 lb (89.359 kg)  11/07/14 194 lb 9.6 oz (88.27 kg)     ASSESSMENT AND PLAN: 1. Coronary artery disease, native vessel. The patient is stable without anginal symptoms. He remains on aspirin 81 mg daily. Other medications are appropriate with atorvastatin, carvedilol, and lisinopril. I would like to see him back in one year.  2. Hypertension. Blood pressure well controlled on carvedilol and lisinopril.   3. Hyperlipidemia. Recent labs reviewed. He continues on high-dose atorvastatin and low-dose niacin. Treated by Dorothea Ogle, PA-C.   4. Type 2 diabetes. He's on an ACE inhibitor in the setting of his CAD. Diabetes management per PCP.  Current medicines are reviewed with the patient today.  The patient does not have concerns regarding medicines.  Labs/ tests ordered today include:   Orders Placed This Encounter  Procedures  . EKG 12-Lead    Disposition:   FU one year  Signed, Sherren Mocha, MD  02/13/2015 1:55 PM    [facility] [address],  [place], [place]  [address] Phone: [phone]; Fax: [phone]

## 2015-02-23 ENCOUNTER — Other Ambulatory Visit: Payer: Self-pay | Admitting: Cardiovascular Disease

## 2015-05-04 ENCOUNTER — Telehealth: Payer: Self-pay | Admitting: Medical

## 2015-05-04 NOTE — Telephone Encounter (Signed)
Pt needs refill Niacin, states can't schedule an appt at this time.  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

## 2015-05-05 ENCOUNTER — Other Ambulatory Visit: Payer: Self-pay | Admitting: Medical

## 2015-05-05 MED ORDER — NIACIN ER (ANTIHYPERLIPIDEMIC) 500 MG PO TBCR
500.0000 mg | EXTENDED_RELEASE_TABLET | Freq: Every day | ORAL | Status: DC
Start: 2015-05-05 — End: 2015-08-02

## 2015-08-02 ENCOUNTER — Telehealth: Payer: Self-pay | Admitting: Medical

## 2015-08-02 MED ORDER — ATORVASTATIN CALCIUM 80 MG PO TABS: 80.0000 mg | ORAL_TABLET | Freq: Every day | ORAL | Status: DC

## 2015-08-02 MED ORDER — NIACIN ER (ANTIHYPERLIPIDEMIC) 500 MG PO TBCR: 500.0000 mg | EXTENDED_RELEASE_TABLET | Freq: Every day | ORAL | Status: DC

## 2015-08-02 NOTE — Telephone Encounter (Signed)
Did 30 day supply

## 2015-08-02 NOTE — Telephone Encounter (Signed)
Pt made [facility] appt for 12/13. Requesting refill on Lipitor 80mg  and Niacin 500mg  to last until appt

## 2015-08-08 ENCOUNTER — Ambulatory Visit (INDEPENDENT_AMBULATORY_CARE_PROVIDER_SITE_OTHER): Payer: Commercial Managed Care - PPO | Admitting: Medical

## 2015-08-08 ENCOUNTER — Encounter: Payer: Self-pay | Admitting: Medical

## 2015-08-08 VITALS — BP 140/80 | HR 54 | Wt 202.0 lb

## 2015-08-08 DIAGNOSIS — D751 Secondary polycythemia: Secondary | ICD-10-CM

## 2015-08-08 DIAGNOSIS — E118 Type 2 diabetes mellitus with unspecified complications: Secondary | ICD-10-CM

## 2015-08-08 DIAGNOSIS — M79674 Pain in right toe(s): Secondary | ICD-10-CM | POA: Insufficient documentation

## 2015-08-08 DIAGNOSIS — E785 Hyperlipidemia, unspecified: Secondary | ICD-10-CM

## 2015-08-08 DIAGNOSIS — I2119 ST elevation (STEMI) myocardial infarction involving other coronary artery of inferior wall: Secondary | ICD-10-CM

## 2015-08-08 DIAGNOSIS — I10 Essential (primary) hypertension: Secondary | ICD-10-CM

## 2015-08-08 DIAGNOSIS — I2510 Atherosclerotic heart disease of native coronary artery without angina pectoris: Secondary | ICD-10-CM | POA: Diagnosis not present

## 2015-08-08 DIAGNOSIS — Z87891 Personal history of nicotine dependence: Secondary | ICD-10-CM

## 2015-08-08 DIAGNOSIS — E108 Type 1 diabetes mellitus with unspecified complications: Secondary | ICD-10-CM

## 2015-08-08 DIAGNOSIS — IMO0002 Reserved for concepts with insufficient information to code with codable children: Secondary | ICD-10-CM | POA: Insufficient documentation

## 2015-08-08 DIAGNOSIS — E1065 Type 1 diabetes mellitus with hyperglycemia: Secondary | ICD-10-CM | POA: Insufficient documentation

## 2015-08-08 LAB — LIPID PANEL
CHOLESTEROL: 135 mg/dL (ref 125–200)
HDL: 36 mg/dL — ABNORMAL LOW (ref >=40)
LDL Cholesterol: 72 mg/dL (ref <130)
TRIGLYCERIDES: 134 mg/dL (ref <150)
Total CHOL/HDL Ratio: 3.8 Ratio (ref <=5.0)
VLDL: 27 mg/dL (ref <30)

## 2015-08-08 LAB — CBC WITH DIFFERENTIAL/PLATELET
BASOS ABS: 0 10*3/uL (ref 0.0–0.1)
Basophils Relative: 0 % (ref 0–1)
EOS ABS: 0.3 10*3/uL (ref 0.0–0.7)
Eosinophils Relative: 3 % (ref 0–5)
HEMATOCRIT: 44 % (ref 39.0–52.0)
HEMOGLOBIN: 15.3 g/dL (ref 13.0–17.0)
LYMPHS ABS: 3.3 10*3/uL (ref 0.7–4.0)
LYMPHS PCT: 34 % (ref 12–46)
MCH: 32 pg (ref 26.0–34.0)
MCHC: 34.8 g/dL (ref 30.0–36.0)
MCV: 92.1 fL (ref 78.0–100.0)
MONOS PCT: 12 % (ref 3–12)
MPV: 10.3 fL (ref 8.6–12.4)
Monocytes Absolute: 1.2 10*3/uL — ABNORMAL HIGH (ref 0.1–1.0)
NEUTROS PCT: 51 % (ref 43–77)
Neutro Abs: 4.9 10*3/uL (ref 1.7–7.7)
PLATELETS: 301 10*3/uL (ref 150–400)
RBC: 4.78 MIL/uL (ref 4.22–5.81)
RDW: 13.7 % (ref 11.5–15.5)
WBC: 9.7 10*3/uL (ref 4.0–10.5)

## 2015-08-08 LAB — COMPREHENSIVE METABOLIC PANEL
ALBUMIN: 4.4 g/dL (ref 3.6–5.1)
ALK PHOS: 46 U/L (ref 40–115)
ALT: 22 U/L (ref 9–46)
AST: 18 U/L (ref 10–35)
BILIRUBIN TOTAL: 0.6 mg/dL (ref 0.2–1.2)
BUN: 11 mg/dL (ref 7–25)
CO2: 27 mmol/L (ref 20–31)
CREATININE: 0.91 mg/dL (ref 0.70–1.25)
Calcium: 9.5 mg/dL (ref 8.6–10.3)
Chloride: 101 mmol/L (ref 98–110)
Glucose, Bld: 149 mg/dL — ABNORMAL HIGH (ref 65–99)
Potassium: 5.1 mmol/L (ref 3.5–5.3)
SODIUM: 140 mmol/L (ref 135–146)
TOTAL PROTEIN: 6.8 g/dL (ref 6.1–8.1)

## 2015-08-08 NOTE — Progress Notes (Signed)
Subjective: Chief Complaint  Patient presents with  . med check    fasting. said that the niacin is causing him to have night sweats. said that his sugar is starting to bottom out, getting in the 60s, said it is making him clamy and wants to know what could be causing that. no concerns.   Here for med check.     Diabetes type 2 - compliant with metformin 500mg  BID.  Sugars lately have been up and down.   Had one episode of sugar at 50, felt shaky.   Not skipping meals.      HTN - compliant with Coreg 6.25mg  BID and  Lisinopril 5mg  for BP and renal protection  Hyperlipidemia - compliant with Lipitor 80mg  and Niacin 500mg  daily.  Taking ASA 81 mg daily  Exercise - not much other than at work.  In addition to working 10 hour days, taking care of his brother who is going downhill.  Brother has been either in hospital or rehab on and off.    Diet - not great, eating whatever he can given his busy schedule at work and having to be main caregiver of his brother.    He says his brother wants to die.     Past Medical History  Diagnosis Date  . Hypertension   . Diabetes mellitus ([place])   . CAD (coronary artery disease)     a. 10/2012: inferolateral wall ST elevation MI s/p stent to distal LCx 10/29/12 (Stentys Apposition Trial randomizing him to a self-expanding bare-metal stent versus a traditional balloon expandable bare-metal coronary stent).   . Hyperlipidemia   . Leukocytosis   . Polycythemia     a. patient reports hx of therapeutic phlebotomy in the 1970s => HCT eventually stabilized and he no longer required phlebotomy  . Former smoker   . Anxiety    Past Surgical History  Procedure Laterality Date  . None         . Left heart catheterization with coronary angiogram N/A 10/29/2012    Procedure: LEFT HEART CATHETERIZATION WITH CORONARY ANGIOGRAM;  Surgeon: Sherren Mocha, MD;  Location: [facility] CATH LAB;  Service: Cardiovascular;  Laterality: N/A;    ROS as in subjective   Objective: BP  140/80 mmHg  Pulse 54  Wt 202 lb (91.627 kg)  General appearence: alert, no distress, WD/WN, white male Oral cavity: MMM, no lesions Neck: supple, no lymphadenopathy, no thyromegaly, no masses Heart: RRR, normal S1, S2, no murmurs Lungs: CTA bilaterally, no wheezes, rhonchi, or rales Abdomen: +bs, soft, non tender, non distended, no masses, no hepatomegaly, no splenomegaly Pulses: 2+ symmetric, upper and lower extremities, normal cap refill Psych: normal behavior, pleasant, answers questions appropriately Ext: no edema   Diabetic Foot Exam - Simple   Simple Foot Form  Diabetic Foot exam was performed with the following findings:  Yes 08/08/2015  8:52 AM  Visual Inspection  See comments:  Yes  Sensation Testing  Intact to touch and monofilament testing bilaterally:  Yes  Pulse Check  See comments:  Yes  Comments  Some yellowing of toenails, 1+ pedal pulses       Assessment: Encounter Diagnoses  Name Primary?  . Type I diabetes mellitus with complication, uncontrolled ([place]) Yes  . CAD in native artery   . Atherosclerosis of native coronary artery of native heart without angina pectoris   . ST elevation myocardial infarction (STEMI) of inferior wall, initial episode of care ([place])   . Former smoker   . Polycythemia   .  Hyperlipidemia   . Essential hypertension   . Toe pain, right      Plan: Discussed diabetes care, routine eye exams, daily foot checks, monitoring glucose, eating healthy, exercise.  He has had a lot of stress dealing with his ill brother.   Discussed priorities, working less hours, spending more time with grandchildren, and finding additional resources for this brother.  C/t same medication, labs today, f/u pending labs.   Of note , he got flu shot at wife's employment back in October.  Daniel Lucas was seen today for med check.  Diagnoses and all orders for this visit:  Type I diabetes mellitus with complication, uncontrolled ([place]) -     Comprehensive  metabolic panel -     Lipid panel -     CBC with Differential/Platelet -     Hemoglobin A1c -     Microalbumin / creatinine urine ratio -     HM DIABETES EYE EXAM -     HM DIABETES FOOT EXAM  CAD in native artery -     Comprehensive metabolic panel -     Lipid panel -     CBC with Differential/Platelet -     Hemoglobin A1c -     Microalbumin / creatinine urine ratio -     HM DIABETES EYE EXAM -     HM DIABETES FOOT EXAM  Atherosclerosis of native coronary artery of native heart without angina pectoris -     Comprehensive metabolic panel -     Lipid panel -     CBC with Differential/Platelet -     Hemoglobin A1c -     Microalbumin / creatinine urine ratio -     HM DIABETES EYE EXAM -     HM DIABETES FOOT EXAM  ST elevation myocardial infarction (STEMI) of inferior wall, initial episode of care ([facility]) -     Comprehensive metabolic panel -     Lipid panel -     CBC with Differential/Platelet -     Hemoglobin A1c -     Microalbumin / creatinine urine ratio -     HM DIABETES EYE EXAM -     HM DIABETES FOOT EXAM  Former smoker -     Comprehensive metabolic panel -     Lipid panel -     CBC with Differential/Platelet -     Hemoglobin A1c -     Microalbumin / creatinine urine ratio -     HM DIABETES EYE EXAM -     HM DIABETES FOOT EXAM  Polycythemia -     Comprehensive metabolic panel -     Lipid panel -     CBC with Differential/Platelet -     Hemoglobin A1c -     Microalbumin / creatinine urine ratio -     HM DIABETES EYE EXAM -     HM DIABETES FOOT EXAM  Hyperlipidemia -     Comprehensive metabolic panel -     Lipid panel -     CBC with Differential/Platelet -     Hemoglobin A1c -     Microalbumin / creatinine urine ratio -     HM DIABETES EYE EXAM -     HM DIABETES FOOT EXAM  Essential hypertension -     Comprehensive metabolic panel -     Lipid panel -     CBC with Differential/Platelet -     Hemoglobin A1c -     Microalbumin / creatinine urine  ratio -      HM DIABETES EYE EXAM -     HM DIABETES FOOT EXAM  Toe pain, right

## 2015-08-09 ENCOUNTER — Other Ambulatory Visit: Payer: Self-pay | Admitting: Medical

## 2015-08-09 DIAGNOSIS — E1159 Type 2 diabetes mellitus with other circulatory complications: Secondary | ICD-10-CM | POA: Insufficient documentation

## 2015-08-09 DIAGNOSIS — E118 Type 2 diabetes mellitus with unspecified complications: Secondary | ICD-10-CM | POA: Insufficient documentation

## 2015-08-09 LAB — MICROALBUMIN / CREATININE URINE RATIO
CREATININE, URINE: 181 mg/dL (ref 20–370)
MICROALB UR: 0.8 mg/dL
MICROALB/CREAT RATIO: 4 ug/mg{creat} (ref <30)

## 2015-08-09 LAB — HEMOGLOBIN A1C
Hgb A1c MFr Bld: 7.5 % — ABNORMAL HIGH (ref <5.7)
Mean Plasma Glucose: 169 mg/dL — ABNORMAL HIGH (ref <117)

## 2015-08-09 MED ORDER — ATORVASTATIN CALCIUM 80 MG PO TABS: 80.0000 mg | ORAL_TABLET | Freq: Every day | ORAL | Status: DC

## 2015-08-09 MED ORDER — ICOSAPENT ETHYL 1 G PO CAPS: 2.0000 | ORAL_CAPSULE | Freq: Two times a day (BID) | ORAL | Status: DC

## 2015-08-09 MED ORDER — METFORMIN HCL 1000 MG PO TABS: 1000.0000 mg | ORAL_TABLET | Freq: Two times a day (BID) | ORAL | Status: DC

## 2015-08-09 MED ORDER — LISINOPRIL 10 MG PO TABS: 10.0000 mg | ORAL_TABLET | Freq: Every day | ORAL | Status: DC

## 2015-08-09 MED ORDER — ASPIRIN 81 MG PO TABS: 81.0000 mg | ORAL_TABLET | Freq: Every day | ORAL | Status: DC

## 2015-08-09 MED ORDER — CARVEDILOL 6.25 MG PO TABS: 6.2500 mg | ORAL_TABLET | Freq: Two times a day (BID) | ORAL | Status: DC

## 2016-01-17 ENCOUNTER — Telehealth: Payer: Self-pay | Admitting: Cardiovascular Disease

## 2016-01-17 NOTE — Telephone Encounter (Signed)
Pt calling to make June appt-has recall but was never sent-pt has forms he needs filled out every year-[place] booked until September and PA's usually can't sign medical forms-can he be worked in in June or can [place] it out?? [phone]

## 2016-01-17 NOTE — Telephone Encounter (Signed)
I spoke with the pt and moved up his appointment to 01/24/16 with [name].

## 2016-01-17 NOTE — Telephone Encounter (Signed)
Pt called in stating that [name] does not have to call him back . He was willing to take the appt in September.

## 2016-01-18 ENCOUNTER — Encounter: Payer: Self-pay | Admitting: Cardiovascular Disease

## 2016-01-24 ENCOUNTER — Ambulatory Visit (INDEPENDENT_AMBULATORY_CARE_PROVIDER_SITE_OTHER): Payer: Commercial Managed Care - PPO | Admitting: Cardiovascular Disease

## 2016-01-24 ENCOUNTER — Encounter: Payer: Self-pay | Admitting: Cardiovascular Disease

## 2016-01-24 VITALS — BP 150/60 | HR 82 | Ht 73.0 in | Wt 201.0 lb

## 2016-01-24 DIAGNOSIS — I2510 Atherosclerotic heart disease of native coronary artery without angina pectoris: Secondary | ICD-10-CM

## 2016-01-24 MED ORDER — LISINOPRIL 10 MG PO TABS: 10.0000 mg | ORAL_TABLET | Freq: Every day | ORAL | Status: DC

## 2016-01-24 MED ORDER — NITROGLYCERIN 0.4 MG SL SUBL: 0.4000 mg | SUBLINGUAL_TABLET | SUBLINGUAL | Status: DC | PRN

## 2016-01-24 MED ORDER — CARVEDILOL 6.25 MG PO TABS: 6.2500 mg | ORAL_TABLET | Freq: Two times a day (BID) | ORAL | Status: DC

## 2016-01-24 NOTE — Patient Instructions (Signed)

## 2016-01-24 NOTE — Progress Notes (Signed)
Cardiology Office Note Date:  01/25/2016   ID:  RUPESH JAKUBCZAK, DOB July 30, 1956, MRN [id number]  PCP:  Crisoforo Oxford, PA-C  Cardiologist:  Sherren Mocha, MD    Chief Complaint  Patient presents with  . essential hypertension    C/O dizziness and sweating. denies cp,sob,lee,or claudication     History of Present Illness: Daniel Lucas is a 60 y.o. male who presents for follow-up evaluation.   The patient has coronary artery disease and he presented in March 2014 with an inferior wall STEMI. The patient has diabetes, hypertension, and history long-standing tobacco abuse. Cardiac catheterization demonstrated multivessel disease, but his left circumflex was the culprit vessel with acute total occlusion. He was treated with a bare-metal stent platform through the Stentys Apposition Trial. His post myocardial infarction left ventricular function was well-preserved with the exception of inferobasal hypokinesis. The LVEF was 55%.   He checks his BP at the pharmacy and it runs 120-130's/70's. States he's worked up over driving here today. He feels well. Today, he denies symptoms of palpitations, chest pain, shortness of breath, orthopnea, PND, lower extremity edema, dizziness, or syncope.  Past Medical History  Diagnosis Date  . Hypertension   . Diabetes mellitus ([place])   . CAD (coronary artery disease)     a. 10/2012: inferolateral wall ST elevation MI s/p stent to distal LCx 10/29/12 (Stentys Apposition Trial randomizing him to a self-expanding bare-metal stent versus a traditional balloon expandable bare-metal coronary stent).   . Hyperlipidemia   . Leukocytosis   . Polycythemia     a. patient reports hx of therapeutic phlebotomy in the 1970s => HCT eventually stabilized and he no longer required phlebotomy  . Former smoker   . Anxiety     Past Surgical History  Procedure Laterality Date  . None         . Left heart catheterization with coronary angiogram N/A 10/29/2012   Procedure: LEFT HEART CATHETERIZATION WITH CORONARY ANGIOGRAM;  Surgeon: Sherren Mocha, MD;  Location: [facility] CATH LAB;  Service: Cardiovascular;  Laterality: N/A;    Current Outpatient Prescriptions  Medication Sig Dispense Refill  . ALPRAZolam (XANAX) 0.5 MG tablet Take 0.5 mg by mouth 4 (four) times daily as needed. For panic attacks    . aspirin 81 MG tablet Take 1 tablet (81 mg total) by mouth daily. 90 tablet 3  . atorvastatin (LIPITOR) 80 MG tablet Take 1 tablet (80 mg total) by mouth daily. 90 tablet 3  . carvedilol (COREG) 6.25 MG tablet Take 1 tablet (6.25 mg total) by mouth 2 (two) times daily with a meal. 180 tablet 3  . Icosapent Ethyl (VASCEPA) 1 G CAPS Take 2 capsules by mouth 2 (two) times daily. 120 capsule 5  . lisinopril (PRINIVIL) 10 MG tablet Take 1 tablet (10 mg total) by mouth daily. 90 tablet 3  . metFORMIN (GLUCOPHAGE) 1000 MG tablet Take 1 tablet (1,000 mg total) by mouth 2 (two) times daily with a meal. 180 tablet 1  . nitroGLYCERIN (NITROSTAT) 0.4 MG SL tablet Place 1 tablet (0.4 mg total) under the tongue every 5 (five) minutes as needed for chest pain (up to 3 doses). 25 tablet 2   No current facility-administered medications for this visit.    Allergies:   Review of patient's allergies indicates no known allergies.   Social History:  The patient  reports that he quit smoking about 3 years ago. He does not have any smokeless tobacco history on file. He reports that  he does not drink alcohol or use illicit drugs.   Family History:  The patient's  family history includes CAD in his father and mother. He was adopted.    ROS:  Please see the history of present illness.  Otherwise, review of systems is positive for depression, easy bruising, anxiety.  All other systems are reviewed and negative.    PHYSICAL EXAM: VS:  BP 150/60 mmHg  Pulse 82  Ht 6\' 1"  (1.854 m)  Wt 201 lb (91.173 kg)  BMI 26.52 kg/m2 , BMI Body mass index is 26.52 kg/(m^2). GEN: Well nourished,  well developed, in no acute distress HEENT: normal Neck: no JVD, no masses. No carotid bruits Cardiac: RRR without murmur or gallop                Respiratory:  clear to auscultation bilaterally, normal work of breathing GI: soft, nontender, nondistended, + BS MS: no deformity or atrophy Ext: no pretibial edema, pedal pulses 2+= bilaterally Skin: warm and dry, no rash Neuro:  Strength and sensation are intact Psych: euthymic mood, full affect  EKG:  EKG is ordered today. The ekg ordered today shows NSR 82 bpm, nonspecific ST change  Recent Labs: 08/08/2015: ALT 22; BUN 11; Creat 0.91; Hemoglobin 15.3; Platelets 301; Potassium 5.1; Sodium 140   Lipid Panel     Component Value Date/Time   CHOL 135 08/08/2015 0001   TRIG 134 08/08/2015 0001   HDL 36* 08/08/2015 0001   CHOLHDL 3.8 08/08/2015 0001   VLDL 27 08/08/2015 0001   LDLCALC 72 08/08/2015 0001      Wt Readings from Last 3 Encounters:  01/24/16 201 lb (91.173 kg)  08/08/15 202 lb (91.627 kg)  02/13/15 198 lb (89.812 kg)    ASSESSMENT AND PLAN: 1. Coronary artery disease, native vessel. The patient is stable without anginal symptoms. Medications reviewed and stable - ASA, carvedilol, lisinopril, and statin Rx.  2. Hypertension. Blood pressure is controlled based on home readings. Continue current Rx.   3. Hyperlipidemia. Reviewed labs. LDL is 72 mg/dL. Continue high-intensity statin Rx with atorvastatin 80 mg.   4. Type 2 diabetes. He's on an ACE inhibitor in the setting of his CAD. Diabetes management per PCP.  Current medicines are reviewed with the patient today.  The patient does not have concerns regarding medicines.  Labs/ tests ordered today include:   Orders Placed This Encounter  Procedures  . EKG 12-Lead    Disposition:   FU one year  Signed, Sherren Mocha, MD  01/25/2016 [facility] [address], [place], [place]  [address] Phone: [phone]; Fax: [phone]

## 2016-01-26 ENCOUNTER — Telehealth: Payer: Self-pay | Admitting: Medical

## 2016-01-26 NOTE — Telephone Encounter (Signed)
pls schedule him for diabetes f/u

## 2016-01-26 NOTE — Telephone Encounter (Signed)
Pt is coming in on June 15th for a diabetes check!!

## 2016-01-31 ENCOUNTER — Encounter: Payer: Self-pay | Admitting: Cardiovascular Disease

## 2016-01-31 NOTE — Telephone Encounter (Signed)
This encounter was created in error - please disregard.

## 2016-01-31 NOTE — Telephone Encounter (Signed)
New Message:  The pt was calling in wanting to speak with [name] about a personal matter. Please f/u with him.

## 2016-02-08 ENCOUNTER — Ambulatory Visit (INDEPENDENT_AMBULATORY_CARE_PROVIDER_SITE_OTHER): Payer: Commercial Managed Care - PPO | Admitting: Medical

## 2016-02-08 ENCOUNTER — Encounter: Payer: Self-pay | Admitting: Medical

## 2016-02-08 VITALS — BP 130/82 | HR 72 | Wt 203.0 lb

## 2016-02-08 DIAGNOSIS — I2510 Atherosclerotic heart disease of native coronary artery without angina pectoris: Secondary | ICD-10-CM

## 2016-02-08 DIAGNOSIS — E785 Hyperlipidemia, unspecified: Secondary | ICD-10-CM

## 2016-02-08 DIAGNOSIS — Z1211 Encounter for screening for malignant neoplasm of colon: Secondary | ICD-10-CM | POA: Diagnosis not present

## 2016-02-08 DIAGNOSIS — Z7185 Encounter for immunization safety counseling: Secondary | ICD-10-CM

## 2016-02-08 DIAGNOSIS — E118 Type 2 diabetes mellitus with unspecified complications: Secondary | ICD-10-CM | POA: Diagnosis not present

## 2016-02-08 DIAGNOSIS — Z7189 Other specified counseling: Secondary | ICD-10-CM | POA: Diagnosis not present

## 2016-02-08 DIAGNOSIS — I10 Essential (primary) hypertension: Secondary | ICD-10-CM

## 2016-02-08 DIAGNOSIS — Z125 Encounter for screening for malignant neoplasm of prostate: Secondary | ICD-10-CM | POA: Diagnosis not present

## 2016-02-08 NOTE — Progress Notes (Signed)
Subjective: Chief Complaint  Patient presents with  . Diabetes    been dropping low. states that it is random he will start sweating and getting dizzy sugar dropped to 62 a few days ago.    He has hx/o diabetes, hypertension, CAD, hyperlipidemia, just saw cardiology recently was advised no need for vasecpa given no benefit in regard to heart disease. Here for f/u on diabetes.   He has had some recent low readings, has had few episodes of dizziness.  Usually checks glucose in morning, sometimes in evening.  He notes recently seeing a number in the 56s, one other time in the 60s, was dizzy.  Has had several times in recent months feeling sweaty and low sugar attack.  Keeps glucose tablets and candy with him just in case.   Compliant with medications.  Taking Metformin 1000mg  BID.  Eats 3 times daily, has cut out bunch of snacks.  He notes last visit sugars were higher due to thanksgiving sweets, holiday sweets.  His brother ended up passing away in 09/2015. Brother had failure to thrive.   No other aggravating or relieving factors. No other complaint.  Past Medical History  Diagnosis Date  . Hypertension   . Diabetes mellitus ([place])   . CAD (coronary artery disease)     a. 10/2012: inferolateral wall ST elevation MI s/p stent to distal LCx 10/29/12 (Stentys Apposition Trial randomizing him to a self-expanding bare-metal stent versus a traditional balloon expandable bare-metal coronary stent).   . Hyperlipidemia   . Leukocytosis   . Polycythemia     a. patient reports hx of therapeutic phlebotomy in the 1970s => HCT eventually stabilized and he no longer required phlebotomy  . Former smoker   . Anxiety    Current Outpatient Prescriptions on File Prior to Visit  Medication Sig Dispense Refill  . ALPRAZolam (XANAX) 0.5 MG tablet Take 0.5 mg by mouth 4 (four) times daily as needed. For panic attacks    . aspirin 81 MG tablet Take 1 tablet (81 mg total) by mouth daily. 90 tablet 3  . atorvastatin  (LIPITOR) 80 MG tablet Take 1 tablet (80 mg total) by mouth daily. 90 tablet 3  . carvedilol (COREG) 6.25 MG tablet Take 1 tablet (6.25 mg total) by mouth 2 (two) times daily with a meal. 180 tablet 3  . Icosapent Ethyl (VASCEPA) 1 G CAPS Take 2 capsules by mouth 2 (two) times daily. 120 capsule 5  . lisinopril (PRINIVIL) 10 MG tablet Take 1 tablet (10 mg total) by mouth daily. 90 tablet 3  . metFORMIN (GLUCOPHAGE) 1000 MG tablet Take 1 tablet (1,000 mg total) by mouth 2 (two) times daily with a meal. 180 tablet 1  . nitroGLYCERIN (NITROSTAT) 0.4 MG SL tablet Place 1 tablet (0.4 mg total) under the tongue every 5 (five) minutes as needed for chest pain (up to 3 doses). (Patient not taking: Reported on 02/08/2016) 25 tablet 2   No current facility-administered medications on file prior to visit.   Past Surgical History  Procedure Laterality Date  . None         . Left heart catheterization with coronary angiogram N/A 10/29/2012    Procedure: LEFT HEART CATHETERIZATION WITH CORONARY ANGIOGRAM;  Surgeon: Sherren Mocha, MD;  Location: [facility] CATH LAB;  Service: Cardiovascular;  Laterality: N/A;    ROS as in subjective   Objective: BP 130/82 mmHg  Pulse 72  Wt 203 lb (92.08 kg)  Wt Readings from Last 3 Encounters:  02/08/16  203 lb (92.08 kg)  01/24/16 201 lb (91.173 kg)  08/08/15 202 lb (91.627 kg)   General appearance: alert, no distress, WD/WN, white male Oral cavity: MMM, no lesions Neck: supple, no lymphadenopathy, no thyromegaly, no masses Heart: RRR, normal S1, S2, no murmurs Lungs: CTA bilaterally, no wheezes, rhonchi, or rales Pulses: 2+ symmetric, upper and lower extremities, normal cap refill  Lab Results  Component Value Date   HGBA1C 7.5* 08/08/2015   HGBA1C 6.7 11/07/2014   HGBA1C 6.5 06/16/2014     Assessment: Encounter Diagnoses  Name Primary?  . Diabetes mellitus with complication in adult patient ([place]) Yes  . Essential hypertension   . CAD in native artery   .  Hyperlipidemia   . Special screening for malignant neoplasms, colon   . Screening for prostate cancer   . Vaccine counseling      Plan: Diabetes - cut back to metformin 1000mg , 1/2 tablet BID.   hydrate well, c/t healthy diet.   Advised yearly eye doctor visit, daily foot checks.   CAD, hyperlipidemia, HTN - c/t Coreg, Lisinopril, reviewed recent cardiology notes Advised yearly flu shot. Advised shingles vaccine, and he will consider Declines referral to GI for colonoscopy Declines prostate screening today.  Jiyaan was seen today for diabetes.  Diagnoses and all orders for this visit:  Diabetes mellitus with complication in adult patient ([place]) -     Hemoglobin A1c  Essential hypertension -     Comprehensive metabolic panel  CAD in native artery  Hyperlipidemia  Special screening for malignant neoplasms, colon  Screening for prostate cancer  Vaccine counseling

## 2016-02-09 ENCOUNTER — Other Ambulatory Visit: Payer: Self-pay | Admitting: Medical

## 2016-02-09 LAB — COMPREHENSIVE METABOLIC PANEL
ALBUMIN: 4.6 g/dL (ref 3.6–5.1)
ALK PHOS: 39 U/L — AB (ref 40–115)
ALT: 26 U/L (ref 9–46)
AST: 22 U/L (ref 10–35)
BILIRUBIN TOTAL: 0.6 mg/dL (ref 0.2–1.2)
BUN: 14 mg/dL (ref 7–25)
CALCIUM: 9.5 mg/dL (ref 8.6–10.3)
CO2: 24 mmol/L (ref 20–31)
CREATININE: 0.9 mg/dL (ref 0.70–1.25)
Chloride: 103 mmol/L (ref 98–110)
Glucose, Bld: 124 mg/dL — ABNORMAL HIGH (ref 65–99)
Potassium: 5.1 mmol/L (ref 3.5–5.3)
SODIUM: 141 mmol/L (ref 135–146)
TOTAL PROTEIN: 6.9 g/dL (ref 6.1–8.1)

## 2016-02-09 LAB — HEMOGLOBIN A1C
HEMOGLOBIN A1C: 7.1 % — AB (ref <5.7)
MEAN PLASMA GLUCOSE: 157 mg/dL

## 2016-02-09 MED ORDER — METFORMIN HCL 500 MG PO TABS: 500.0000 mg | ORAL_TABLET | Freq: Two times a day (BID) | ORAL | Status: DC

## 2016-02-09 MED ORDER — ASPIRIN 81 MG PO TABS: 81.0000 mg | ORAL_TABLET | Freq: Every day | ORAL | Status: DC

## 2016-02-13 ENCOUNTER — Encounter: Payer: Self-pay | Admitting: Medical

## 2016-02-13 ENCOUNTER — Ambulatory Visit (INDEPENDENT_AMBULATORY_CARE_PROVIDER_SITE_OTHER): Payer: Commercial Managed Care - PPO | Admitting: Medical

## 2016-02-13 VITALS — BP 130/80 | HR 76 | Wt 203.0 lb

## 2016-02-13 DIAGNOSIS — M79601 Pain in right arm: Secondary | ICD-10-CM

## 2016-02-13 DIAGNOSIS — R202 Paresthesia of skin: Secondary | ICD-10-CM

## 2016-02-13 DIAGNOSIS — R29898 Other symptoms and signs involving the musculoskeletal system: Secondary | ICD-10-CM

## 2016-02-13 NOTE — Progress Notes (Signed)
Subjective: Chief Complaint  Patient presents with  . lt arm pain    said that as soon as his blood drawn he had intense pain in his arm. had red blotches all over it. been using cortisone cream. has not been able to use his arm, said Denice Paradise was apologizing as soon as it happened but it is getting worse pain wise.    DOI 02/08/16.   Here for right arm pain.   I saw him here on 02/08/16 for visit and labs.  He notes during the lab draw here that day, that upon venipuncture he noted immediate pain and states that the phlebotomist here stated "I hit nerve, I hit nerve."  He says they pulled the syringe back and redirected.  But with the initial venipuncture felt pain all the way the right arm to the wrist.  Immediately after the venipuncture he notes having large oval/round area of redness of the right arm (antecubital area).   When he left he felt he had a red line down the right forearm.   That resolved the next day.   But over the last few days has continued to have pain in a line down right forearm, and he still notes some bruise and swelling in the right arm antecubital region.  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   Not been able to do his normal routine over the last week with the right arm since the injury.  Using some Aspirin, but no other medication, used some heat last night.  No other aggravating or relieving factors. No other complaint.  Past Medical History  Diagnosis Date  . Hypertension   . Diabetes mellitus ([place])   . CAD (coronary artery disease)     a. 10/2012: inferolateral wall ST elevation MI s/p stent to distal LCx 10/29/12 (Stentys Apposition Trial randomizing him to a self-expanding bare-metal stent versus a traditional balloon expandable bare-metal coronary stent).   . Hyperlipidemia   . Leukocytosis   . Polycythemia     a. patient reports hx of therapeutic phlebotomy in the 1970s => HCT  eventually stabilized and he no longer required phlebotomy  . Former smoker   . Anxiety    ROS as in subjective   Objective: BP 130/80 mmHg  Pulse 76  Wt 203 lb (92.08 kg)  Gen: wd, wn, nad Skin: faint round brown 2cm bruise of antecubital region, otherwise no erythema or bruising Tender along right antecubital region, right forearm throughout from antecubital region to the wrist along anterior forearm, mild pain with wrist ROM which is decreased, no palpable cord, no other mass or lesions grip and wrist strength on the right is 4-5/5 compared to left, mild pain with wrist ROM 1+ bilat pulses, radial and ulnar pulse palpable, normal cap refill, but right fingers cooler to touch compared to left hand fingers No other deformity, rest of arms nontender with normal ROM    Assessment: Encounter Diagnoses  Name Primary?  . Right arm pain Yes  . Arm paresthesia, right   . Weakness of right arm      Plan: discussed concerns, symptoms, injury, findings, and recommendations.   It sounds like a nerve was irritated with the venipuncture.  There is no obvious phlebitis or infection.    There is tenderness and some weakness on exam.  Discussed recommendations below, call if worse in the next week, otherwise f/u  in 2wk.  Consider nerve conduction study if not improving.   Discussed case with supervising physician Dr. Redmond School.   Patient Instructions   Encounter Diagnoses  Name Primary?  . Right arm pain Yes  . Arm paresthesia, right   . Weakness of right arm     Recommendations:  Begin Aleve OTC twice daily for the next week  Use an OTC arm sling for an hour at a time for the next week  Use bag of frozen peas for ice the to the forearm over the next 3 days  The pain and weakness may take a few weeks to fully resolve  If worse in the next few days, let me know  Recheck in 2 weeks

## 2016-02-13 NOTE — Patient Instructions (Signed)
Encounter Diagnoses  Name Primary?  . Right arm pain Yes  . Arm paresthesia, right   . Weakness of right arm     Recommendations:  Begin Aleve OTC twice daily for the next week  Use an OTC arm sling for an hour at a time for the next week  Use bag of frozen peas for ice the to the forearm over the next 3 days  The pain and weakness may take a few weeks to fully resolve  If worse in the next few days, let me know  Recheck in 2 weeks

## 2016-03-01 ENCOUNTER — Encounter: Payer: Self-pay | Admitting: Medical

## 2016-03-01 ENCOUNTER — Ambulatory Visit (INDEPENDENT_AMBULATORY_CARE_PROVIDER_SITE_OTHER): Payer: Commercial Managed Care - PPO | Admitting: Medical

## 2016-03-01 VITALS — BP 130/70 | HR 77 | Wt 201.0 lb

## 2016-03-01 DIAGNOSIS — R202 Paresthesia of skin: Secondary | ICD-10-CM

## 2016-03-01 DIAGNOSIS — W278XXA Contact with other nonpowered hand tool, initial encounter: Secondary | ICD-10-CM

## 2016-03-01 DIAGNOSIS — M79601 Pain in right arm: Secondary | ICD-10-CM

## 2016-03-01 DIAGNOSIS — IMO0002 Reserved for concepts with insufficient information to code with codable children: Secondary | ICD-10-CM

## 2016-03-01 DIAGNOSIS — R29898 Other symptoms and signs involving the musculoskeletal system: Secondary | ICD-10-CM | POA: Diagnosis not present

## 2016-03-01 MED ORDER — GABAPENTIN 100 MG PO CAPS: 100.0000 mg | ORAL_CAPSULE | Freq: Two times a day (BID) | ORAL | Status: DC

## 2016-03-01 NOTE — Progress Notes (Signed)
Subjective: Chief Complaint  Patient presents with  . Arm Pain    is still having rt arm pain. burning sensation around his thumb area. has had to take off of work since his work requires heavy lifting. is icing it and using his sling   DOI 02/08/16.   Here for recheck on right arm pain.  I saw him on 02/13/16 for the same.    The history from that visit is as follows: Here for right arm pain.   I saw him here on 02/08/16 for visit and labs.  He notes during the lab draw here that day, that upon venipuncture he noted immediate pain and states that the phlebotomist here stated "I hit nerve, I hit nerve."  He says they pulled the syringe back and redirected.  But with the initial venipuncture felt pain all the way the right arm to the wrist.  Immediately after the venipuncture he notes having large oval/round area of redness of the right arm (antecubital area).   When he left he felt he had a red line down the right forearm.   That resolved the next day.   But over the last few days has continued to have pain in a line down right forearm, and he still notes some bruise and swelling in the right arm antecubital region.  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   Not been able to do his normal routine over the last week with the right arm since the injury.  Using some Aspirin, but no other medication, used some heat last night.    Since last visit I asked him to use Aleve OTC twice daily for a week, OTC arm sling for an hour at a time for the next week, bag of frozen peas for ice the to the forearm over the next 3 days,   He notes doing those things somewhat, aleve, ice, arm sling.  At this point the pain is more focal in the right distal forearm and right thumb.  Has a constant burning pain in the right wrist and thumb.  Has dropped a few things from time to time.   No other numbness or weakness.  No other aggravating  or relieving factors. No other complaint.   Past Medical History  Diagnosis Date  . Hypertension   . Diabetes mellitus ([place])   . CAD (coronary artery disease)     a. 10/2012: inferolateral wall ST elevation MI s/p stent to distal LCx 10/29/12 (Stentys Apposition Trial randomizing him to a self-expanding bare-metal stent versus a traditional balloon expandable bare-metal coronary stent).   . Hyperlipidemia   . Leukocytosis   . Polycythemia     a. patient reports hx of therapeutic phlebotomy in the 1970s => HCT eventually stabilized and he no longer required phlebotomy  . Former smoker   . Anxiety    ROS as in subjective   Objective: BP 130/70 mmHg  Pulse 77  Wt 201 lb (91.173 kg)  Gen: wd, wn, nad Skin dorsal right hand with somewhat thin skin, some pink/purplish coloration suggestive of senile purpura, otherwise no erythema or bruising Tender along right forearm and wrist laterally, tender along base of right thumb, mild pain with right thumb flexion and extension, faint pain noted with extension of right 2nd and 3rd fingers, otherwise hand and arm nonattendance, no palpable cord, no other mass or lesions grip and  wrist strength on the right is 5/5, 4-5/5 strength with  right index and middle finger extension, otherwise strength and sensation normal right hand fingers and arm 1+ bilat pulses, radial and ulnar pulse palpable, normal cap refill, cool fingers bilat No other deformity, rest of arms nontender with normal ROM    Assessment: Encounter Diagnoses  Name Primary?  . Right arm pain Yes  . Arm paresthesia, right   . Right arm weakness   . Needle stick injury      Plan: discussed concerns, symptoms, injury, findings, and recommendations.   It sounds like a nerve was irritated with the venipuncture.  There is no obvious phlebitis or infection.    There is tenderness and some weakness on exam.   He is showing improvement since last visit.   C/t recommendations  below.   Patient Instructions   Encounter Diagnoses  Name Primary?  . Right arm pain Yes  . Arm paresthesia, right   . Right arm weakness   . Needle stick injury    Recommendations:  Begin thumb spica splint right arm during the day  Call if you can't find this.  I wrote a prescription for this  Continue to rest and elevated the arm at night  Limit use of the right hand for now, no heavy lifting with right hand  You can use Aleve during the day OTC  Begin Gabapentin 100mg  at bedtime.  After 1 week if not improvement of the burning pain, then go to twice daily  If not resolved or much better within 2 weeks, then consider recheck or referral to specialist.

## 2016-03-01 NOTE — Patient Instructions (Signed)
Encounter Diagnoses  Name Primary?  . Right arm pain Yes  . Arm paresthesia, right   . Right arm weakness   . Needle stick injury    Recommendations:  Begin thumb spica splint right arm during the day  Call if you can't find this.  I wrote a prescription for this  Continue to rest and elevated the arm at night  Limit use of the right hand for now, no heavy lifting with right hand  You can use Aleve during the day OTC  Begin Gabapentin 100mg  at bedtime.  After 1 week if not improvement of the burning pain, then go to twice daily  If not resolved or much better within 2 weeks, then consider recheck or referral to specialist.

## 2016-03-26 ENCOUNTER — Telehealth: Payer: Self-pay

## 2016-03-26 ENCOUNTER — Telehealth: Payer: Self-pay | Admitting: Neurology

## 2016-03-26 DIAGNOSIS — M25529 Pain in unspecified elbow: Secondary | ICD-10-CM

## 2016-03-26 NOTE — Telephone Encounter (Signed)
Pt states that his arm is still bothering him. He is losing his dexterity with shooting pain. The pills have not helped him. [phone]. Daniel Lucas

## 2016-03-26 NOTE — Telephone Encounter (Signed)
At this point lets refer to Dr. Aquino/neurology.   Lets complete a safety zone portal on this as well

## 2016-03-26 NOTE — Telephone Encounter (Signed)
ERROR

## 2016-03-26 NOTE — Addendum Note (Signed)
Addended by: Billie Lade on: 03/26/2016 10:57 AM   Modules accepted: Orders

## 2016-03-26 NOTE — Telephone Encounter (Signed)
Referred to Dr Delice Lesch and told Caren Griffins in the lab and she is going to see what her supervisor says I am also putting in safety zone portal

## 2016-03-27 ENCOUNTER — Telehealth: Payer: Self-pay | Admitting: Medical

## 2016-03-27 NOTE — Telephone Encounter (Signed)
Called pt & informed per [name] we are waiting to hear back from [facility] on how they want to handle this situation and we will let him know ASAP

## 2016-03-28 ENCOUNTER — Telehealth: Payer: Self-pay | Admitting: Family Medicine

## 2016-03-28 NOTE — Telephone Encounter (Signed)
Next step for me is referral to neurology

## 2016-03-28 NOTE — Telephone Encounter (Signed)
Returned call to Daniel Lucas this morning.  Explained to him that I was told he wanted a referral to a specialist and was concerned that we needed to pay for it.  He said No, Daniel Lucas wanted him to go to specialist since he was not getting any better.  I explained that Daniel Lucas with Daniel Lucas advised this morning that they are starting an investigation of what happened and if he wanted to go a head to the specialist and he pay for it, that the specialist should be able to tell him what is going on with his arm.  He said he knew what was going on with his arm.  I explained that once the investigation was over then Daniel Lucas would let us know.  Pt advised Daniel Lucas stuck a needle thru the Lucas of his arm and also that Daniel Lucas told Daniel Lucas that she did, that she run the needle thru his arm.  He said it hurts like crap when he tries to grab something and shoots pain into the Lucas of his arm from his wrist.  Can't grab steering wheel.  It said he hasn't worked in 2 months.  I explained I was not aware he was not working.  He said he works and just does [employer] work and it is not paying the bills.  He said, he now see's what is going on.  He said he was going to contact his friend [facility] with patient compliance at [facility] and talk to him and see what he needed to do.  I explained Daniel Lucas has not said they weren't going to do anything, they just wanted to get the details of what happened.  I explained I would stay on top of this and keep him informed of what was going on.

## 2016-04-04 NOTE — Telephone Encounter (Signed)
Daniel Lucas called and states she spoke with pt on 04/02/16 and said he was very upset, he felt like Daniel Lucas was trying to hide this incident.  As pt stated Daniel Lucas was concerned it would go through [facility].  Daniel advised pt when he goes to Neuro to get two copies of the report and she will come and pick it up from him.  Daniel states Daniel Lucas never told her about the incident.  Pt advised Daniel that this happened in Dec again when she drew his blood that it caused great pain. Daniel advised pt that Daniel Lucas would have no way of knowing if she hit a nerve.   Pt states he is working, that Daniel Lucas told him to take July 4th week off.  Other than that he has been working.  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  He said he is probably going to go see who his wife see.  He advised her he is talking to some attorneys.  Pt wants Daniel Lucas to call him

## 2016-04-05 NOTE — Telephone Encounter (Signed)
I will just add my notes from witnessing the phone call  Patient stated that he did not ask for [name] to call him that the [place] person said she had been trying to reach [place]. Him and [name] discussed referral and nerve study.   Pt said it is still burning in his arm.   [name] asked the patient about the statement below where it says he felt that [name] was trying to hide things. The pt denied that statement. He said it was more the [place] person asking questions then him stating anything. Said he did get angry with the [place] person and he felt like they were pushing as if they do not want to pay for anything.   He said "crap happens" and he had no questions or concerns for Shane at this time. Is not angry or upset.

## 2016-04-05 NOTE — Telephone Encounter (Signed)
I called Mr. Birenbaum per prior phone call request.   I had my CMA Drucilla Schmidt in the room as a witness with patient on speaker phone.  Advise Mr. Amend that he was on speaker phone with witness in the room.   I called to first ask if he has concerns and questions.  He said he had no questions, was surprised I called, and didn't now why I called.  I advised that I was aware that the representative from [place] had spoken with him as well as our Education officer, museum.  He stated that the arm symptoms are still about the same, burning pain in right forearm and wrist that he still attributes to needles stick.   I asked him about Careers adviser conversation with him where he reportedly commented about him being upset with me/us and that I was trying to hide this needlestick incident.  He stated that he wasn't upset and never said I was trying to hide anything.  He denied those comments.   He said the he was aware we referred him to neurology, has appointment scheduled for 06/07/16 with neurology.    I advised that I wanted him to keep his appointment with neurology, that I have nothing else I can help with with at this point until neurology evaluates him.    He had no other questions, didn't seem to be upset, and he said he just wants to get better.   I advised he call back if concerns and that I hope his symptoms resolve soon.  The overall tone of the conversation was fine, neutral, and he didn't seem to be angry or upset.     I asked Drucilla Schmidt to add her comments to this message in case she has other details she wanted to add. Dorothea Ogle, PA-C

## 2016-04-08 NOTE — Telephone Encounter (Signed)
reviewed

## 2016-05-13 ENCOUNTER — Ambulatory Visit: Payer: Commercial Managed Care - PPO | Admitting: Cardiovascular Disease

## 2016-05-28 ENCOUNTER — Encounter: Payer: Self-pay | Admitting: Neurology

## 2016-05-28 ENCOUNTER — Ambulatory Visit (INDEPENDENT_AMBULATORY_CARE_PROVIDER_SITE_OTHER): Payer: Commercial Managed Care - PPO | Admitting: Neurology

## 2016-05-28 VITALS — BP 110/80 | HR 83 | Ht 73.0 in | Wt 204.3 lb

## 2016-05-28 DIAGNOSIS — R29898 Other symptoms and signs involving the musculoskeletal system: Secondary | ICD-10-CM

## 2016-05-28 DIAGNOSIS — S5431XA Injury of cutaneous sensory nerve at forearm level, right arm, initial encounter: Secondary | ICD-10-CM | POA: Insufficient documentation

## 2016-05-28 DIAGNOSIS — H539 Unspecified visual disturbance: Secondary | ICD-10-CM

## 2016-05-28 NOTE — Patient Instructions (Addendum)
Continue your home exercises If you change your mind about physical therapy, please call my office If your symptoms do not improve, call my office to set up nerve testing Follow-up with your eye doctor for vision changes

## 2016-05-28 NOTE — Progress Notes (Signed)
[facility] Clinic Note - Initial Visit   Date: 05/28/16  Daniel Lucas MRN: [id number] DOB: 10-26-1955   Dear Chana Bode, PA:  Thank you for your kind referral of Daniel Lucas [facility] for consultation of right arm pain. Although his history is well known to you, please allow [place] to reiterate it for the purpose of our medical record. The patient was accompanied to the clinic by self.  History of Present Illness: Daniel Lucas is a 60 y.o. ambidextrous-handed Caucasian male with diabetes mellitus, hypertension, hyperlipidemia, former smoker, and CAD s/p PCI (2014) presenting for evaluation of right arm pain.    He reports having blood drawn in June 2017 and immediately during the procedure, he developed severe shooting pain over the lateral forearm from the antecubital fossa to the wrist.  The phlebotomist stated "I hit nerve, I hit the nerve".  He had constant pain over the same region for 2 months.  He noticed worsening pain with wrist ulnar and radial deviation, but no pain with wrist flexion and extension.  Starting in early September, the intensity of the burning sensation started improving and over the past 2-3 weeks, the pain has significantly improved and now only has burning sensation over the right distal forearm/wrist. He felt as if the right arm was also weakness, but cannot describe the location of weakness very well.  He tried gabapentin for a few days, but did not continue it.   He also complains of left visual field blurriness in July which lasted a few weeks.  He denies any limb weakness or facial weakness.  He has a CT head in [place] which was negative.  He states that he is unable to get MRI because of his cardiac stents.  Upon further questioning, he reports having very poor visual acuity in the left eye from childhood.  He is scheduled to see his eye doctor in January.   Out-side paper records, electronic medical record, and images have been  reviewed where available and summarized as:  Lab Results  Component Value Date   TSH 0.648 10/29/2012   Lab Results  Component Value Date   HGBA1C 7.1 (H) 02/08/2016   MRI cervical spine wo contrast 10/08/2007: 1. The cervical spine demonstrates multilevel foraminal stenoses and mild central stenoses as detailed above secondary to degenerative disc disease and cervical spondylosis. The foraminal stenoses appear more severe than those described in the report from 07/09/2001. 2. Small T2 hyperintense lesion posteriorly in the left lobe of the thyroid, technically nonspecific. If this has not been previously worked up, consider thyroid ultrasound.  MRI lumbar 10/08/2007: 1. Moderate central stenosis L3-L4 and mild central stenosis at L4-L5. 2. Multilevel disc bulges, with mildly congenitally short pedicles and prominence of the lumbar epidural adipose tissues.   Past Medical History:  Diagnosis Date  . Anxiety   . CAD (coronary artery disease)    a. 10/2012: inferolateral wall ST elevation MI s/p stent to distal LCx 10/29/12 (Stentys Apposition Trial randomizing him to a self-expanding bare-metal stent versus a traditional balloon expandable bare-metal coronary stent).   . Diabetes mellitus ([place])   . Former smoker   . Hyperlipidemia   . Hypertension   . Leukocytosis   . Polycythemia    a. patient reports hx of therapeutic phlebotomy in the 1970s => HCT eventually stabilized and he no longer required phlebotomy    Past Surgical History:  Procedure Laterality Date  . LEFT HEART CATHETERIZATION WITH CORONARY ANGIOGRAM N/A 10/29/2012  Procedure: LEFT HEART CATHETERIZATION WITH CORONARY ANGIOGRAM;  Surgeon: Sherren Mocha, MD;  Location: [facility] CATH LAB;  Service: Cardiovascular;  Laterality: N/A;  . none           Medications:  Outpatient Encounter Prescriptions as of 05/28/2016  Medication Sig  . ALPRAZolam (XANAX) 0.5 MG tablet Take 0.5 mg by mouth 4 (four) times daily as needed.  Reported on 03/01/2016  . aspirin 81 MG tablet Take 1 tablet (81 mg total) by mouth daily.  Marland Kitchen atorvastatin (LIPITOR) 80 MG tablet Take 1 tablet (80 mg total) by mouth daily.  . carvedilol (COREG) 6.25 MG tablet Take 1 tablet (6.25 mg total) by mouth 2 (two) times daily with a meal.  . gabapentin (NEURONTIN) 100 MG capsule Take 1 capsule (100 mg total) by mouth 2 (two) times daily.  Marland Kitchen lisinopril (PRINIVIL) 10 MG tablet Take 1 tablet (10 mg total) by mouth daily.  . metFORMIN (GLUCOPHAGE) 500 MG tablet Take 1 tablet (500 mg total) by mouth 2 (two) times daily with a meal. 1 tablet po BID for diabetes  . nitroGLYCERIN (NITROSTAT) 0.4 MG SL tablet Place 1 tablet (0.4 mg total) under the tongue every 5 (five) minutes as needed for chest pain (up to 3 doses).   No facility-administered encounter medications on file as of 05/28/2016.      Allergies: No Known Allergies  Family History: Family History  Problem Relation Age of Onset  . Adopted: Yes  . CAD Mother   . Stroke Mother   . CAD Father   . Diabetes Mellitus II Sister   . CAD Brother   . Diabetes Mellitus II Brother     Social History: Social History  Substance Use Topics  . Smoking status: Former Smoker    Packs/day: 1.00    Years: 30.00    Quit date: 06/16/2012  . Smokeless tobacco: Never Used  . Alcohol use No   Social History   Social History Narrative   Married, lives with his wife in a 2 story home.  At least one grandfather had a history of coronary artery disease but no CAD in any first-degree relatives.   Works as a Pension scheme manager.  Has 3 children.  Education: high school.    Review of Systems:  CONSTITUTIONAL: No fevers, chills, night sweats, or weight loss.   EYES: No visual changes or eye pain ENT: No hearing changes.  No history of nose bleeds.   RESPIRATORY: No cough, wheezing and shortness of breath.   CARDIOVASCULAR: Negative for chest pain, and palpitations.   GI: Negative for abdominal discomfort, blood in  stools or black stools.  No recent change in bowel habits.   GU:  No history of incontinence.   MUSCLOSKELETAL: +history of joint pain or swelling.  No myalgias.   SKIN: Negative for lesions, rash, and itching.   HEMATOLOGY/ONCOLOGY: Negative for prolonged bleeding, bruising easily, and swollen nodes.  No history of cancer.   ENDOCRINE: Negative for cold or heat intolerance, polydipsia or goiter.   PSYCH:  No depression or anxiety symptoms.   NEURO: As Above.   Vital Signs:  BP 110/80   Pulse 83   Ht 6\' 1"  (1.854 m)   Wt 204 lb 5 oz (92.7 kg)   SpO2 97%   BMI 26.96 kg/m     General Medical Exam:   General:  Well appearing, comfortable.   Eyes/ENT: see cranial nerve examination.   Neck: No masses appreciated.  Full range of motion without tenderness.  No carotid bruits. Respiratory:  Clear to auscultation, good air entry bilaterally.   Cardiac:  Regular rate and rhythm, no murmur.   Extremities:  No deformities, edema, or skin discoloration.  Skin:  No rashes or lesions.  Neurological Exam: MENTAL STATUS including orientation to time, place, person, recent and remote memory, attention span and concentration, language, and fund of knowledge is normal.  Speech is not dysarthric.  CRANIAL NERVES: II:  No visual field defects.  Unremarkable fundi.   III-IV-VI: Pupils equal round and reactive to light.  Normal conjugate, extra-ocular eye movements in all directions of gaze.  No nystagmus.  Mild left ptosis (old).   V:  Normal facial sensation.   VII:  Normal facial symmetry and movements.   VIII:  Normal hearing and vestibular function.   IX-X:  Normal palatal movement.   XI:  Normal shoulder shrug and head rotation.   XII:  Normal tongue strength and range of motion, no deviation or fasciculation.  MOTOR:  No atrophy, fasciculations or abnormal movements.  No pronator drift.  Tone is normal.    Right Upper Extremity:    Left Upper Extremity:    Deltoid  5/5   Deltoid  5/5     Biceps  5/5   Biceps  5/5   Triceps  5/5   Triceps  5/5   Wrist extensors  5-/5   Wrist extensors  5/5   Wrist flexors  5-/5   Wrist flexors  5/5   Finger extensors  5-/5   Finger extensors  5/5   Finger flexors  5/5   Finger flexors  5/5   Dorsal interossei  5-/5   Dorsal interossei  5/5   Abductor pollicis  5/5   Abductor pollicis  5/5   Tone ([place] scale)  0  Tone ([place] scale)  0   Right Lower Extremity:    Left Lower Extremity:    Hip flexors  5/5   Hip flexors  5/5   Hip extensors  5/5   Hip extensors  5/5   Knee flexors  5/5   Knee flexors  5/5   Knee extensors  5/5   Knee extensors  5/5   Dorsiflexors  5/5   Dorsiflexors  5/5   Plantarflexors  5/5   Plantarflexors  5/5   Toe extensors  5/5   Toe extensors  5/5   Toe flexors  5/5   Toe flexors  5/5   Tone (Ashworth scale)  0  Tone (Ashworth scale)  0   MSRs:  Right                                                                 Left brachioradialis 2+  brachioradialis 2+  biceps 2+  biceps 2+  triceps 2+  triceps 2+  patellar 1+  patellar 1+  ankle jerk 1+  ankle jerk 1+  Hoffman no  Hoffman no  plantar response down  plantar response down   SENSORY: Reduced sensation to pin prick and temperature over the distal portion of the right lateral antebrachial cutaneous nerve.  Sensation in the legs and over the left arm is intact to all modalities.   Romberg's sign absent.   COORDINATION/GAIT: Normal finger-to- nose-finger.  Intact rapid alternating movements bilaterally.  Gait narrow based and stable. Tandem and stressed gait intact.    IMPRESSION: Daniel Lucas is a 60 year-old gentleman referred for evaluation of right shooting pain and dysesthesia over the right side anterolateral aspect of forearm following phlebotomy in June 2017. Phlebotomy around lateral aspect of antecubital fossa may cause lateral antebrachial cutaneous nerve injury, which is most consistent with his symptoms.  He has some weakness of the hand  which does not clearly follow a nerve distribution as it involves wrist extensors/flexion, finger extensors, and finger abductors.  His weakness may be due to deconditioning as he admits that he tends to protect the hand since this injury.  I explained that electrodiagnostic studies are needed for definite diagnosis to localize his injury and to prognosticate recovery.  He does not wish to do any additional testing because his pain is significantly improved over the past month.  I recommended that he start physical therapy to strengthen his hand, but he prefers to do his own home exercises.  If there is any worsening of weakness or pain, he will contact my office to set up NCS/EMG of the right arm (brachial plexus protocol).    Regarding his vision complains which were transient in June and described as blurry vision over the left side, I have asked him to follow-up with his eye doctor for visual field testing.  He reportedly had CT head in [place], [place] which was normal, but I do not have these reports or images to review.  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  If he has any new symptoms, he will need repeat CT head and vessel imaging.   The duration of this appointment visit was 50 minutes of face-to-face time with the patient.  Greater than 50% of this time was spent in counseling, explanation of diagnosis, planning of further management, and coordination of care.   Thank you for allowing me to participate in patient's care.  If I can answer any additional questions, I would be pleased to do so.    Sincerely,    Donika K. Posey Pronto, DO

## 2016-06-07 ENCOUNTER — Ambulatory Visit: Payer: Commercial Managed Care - PPO | Admitting: Neurology

## 2016-07-05 ENCOUNTER — Telehealth: Payer: Self-pay

## 2016-07-05 NOTE — Telephone Encounter (Signed)
Pt called the office and reports that the neurologist was supposed to call and talk to [name] about a referral to therapy for his arm. He states that he has called [employer] and is awaiting a callback from October 13th. Pt states he is concerned about how this referral will take place and who will pay for it. Pt states this situation has been turned over to [name], but he has not heard anything. Advised pt  to leave a message for [name] to call him back or for him to call [facility] again. Offered to provide number for [name] to patient. He declined. Pt states, "I'm tired of calling everyone, I'll just get a lawyer and let them talk to you."    Pt then called the office back and states that he wants the number to [name]. [phone] [name] provided this number to him.   Pt called the office back about 15 minutes later stating he tried to call them, but reach the billing department and was disconnected. Pt is demanding that we contact [name] for him, as he is tired of "wasting his time."  Advised pt that [name] can call him again on Monday. Pt then disconnected our call.    Daniel Lucas wants you to look and see what the neurologist recommended and f/u with her about this.   Thanks, [name]

## 2016-07-05 NOTE — Telephone Encounter (Signed)
I reviewed neurology notes.  They specifically say that he wanted to do his own home exercises per last visit instead of getting the PT referral.  So at this point, he needs to contact neuro about the referral.  [name] - will you contact whoever you have been working with at [employer] on his case.   There isn't much I can do for him in regards to his arm.

## 2016-07-08 NOTE — Telephone Encounter (Signed)
Called pt this morning,  He said he is very aggrevated that nothing is getting done.  I explained to him that Era was supposed to call him this afternoon after lunch.  As I spoke with Era this morning and she has been awaiting the report and diagnosis from the patient, as she had given him [facility] info, along with her direct number and told him once she got the report from him, she would file it.  The patient does not remember any of that.  Pt states he has been coming in here ever 2 1/2 weeks regarding his arm pain.    I asked pt if I could give [facility] copy of the report from the neurologist and he said that was fine.  Pt said the neurologist Dr. Minta Balsam or Dr. Posey Pronto said definitely nerve damage of a needle going through the nerve.  Print copy of Dr. Posey Pronto report and gave to [facility].

## 2016-10-02 ENCOUNTER — Telehealth: Payer: Self-pay | Admitting: Family Medicine

## 2016-10-02 NOTE — Telephone Encounter (Signed)
Reviewed chart.  I had spoken with Era at [facility] on 07/16/16 she stated that she has turned everything over to their legal department to handle.

## 2016-10-04 ENCOUNTER — Telehealth: Payer: Self-pay

## 2016-10-04 ENCOUNTER — Other Ambulatory Visit: Payer: Self-pay

## 2016-10-04 DIAGNOSIS — E119 Type 2 diabetes mellitus without complications: Secondary | ICD-10-CM

## 2016-10-04 MED ORDER — GLUCOSE BLOOD VI STRP: ORAL_STRIP | 12 refills | Status: DC

## 2016-10-04 NOTE — Telephone Encounter (Signed)
Sent refill to pharmacy. 

## 2016-10-04 NOTE — Telephone Encounter (Signed)
Pt needs accu-chek aviva test strips sent to [employer] [facility]. Daniel Lucas

## 2016-10-11 ENCOUNTER — Telehealth: Payer: Self-pay | Admitting: Medical

## 2016-10-11 NOTE — Telephone Encounter (Signed)
Recv'd fax that [facility] not covered.  [facility] T# [phone] & Accu check is plan exclusion and One touch is preferred.  Please switch all his diabetes meter & supplies to any One Touch product

## 2016-10-14 MED ORDER — GLUCOSE BLOOD VI STRP: ORAL_STRIP | 1 refills | Status: DC

## 2016-10-14 MED ORDER — ONETOUCH DELICA LANCETS FINE MISC: 0 refills | Status: DC

## 2016-10-14 MED ORDER — ONETOUCH VERIO FLEX SYSTEM W/DEVICE KIT: 1.0000 | PACK | Freq: Every day | 0 refills | Status: DC

## 2016-10-14 NOTE — Telephone Encounter (Signed)
Sent in meter,test strips and lancets to pharmacy

## 2016-12-02 ENCOUNTER — Telehealth: Payer: Self-pay | Admitting: Family Medicine

## 2016-12-02 NOTE — Telephone Encounter (Signed)
Pt called and advised that he has never heard anything from the legal department at [facility].  He states he is not responsible for the bill from Dr. Posey Pronto.  And that he is out of pocket a lot of money.  That he paid Dr. Posey Pronto office $50.00 copay and he doesn't get the work he used to do.  I told him I had no idea that he had not heard from [employer].  That I was very surprised and would find out for him what had happened.  I asked him was he receiving a bill from Dr. Serita Grit office and he said NO, and he wasn't going to pay the bill.  He said he had talked with his insurance company and told them the situation and they told him they were not going to pay the bill either.  Again I asked him has he received a bill from Dr. Posey Pronto and he said "no."  He said [facility] sent him a bill for the lab draw from that day, and he called them and told them he was not going to pay that bill.  I called Era of [facility] to let her know that no one from their legal department had ever contacted the patient and she said she was speechless, she could not believe it.  She was not in the office at this moment, but should be back around 3:00 and will call legal and will call me back.  As I need to call the patient back.

## 2016-12-02 NOTE — Telephone Encounter (Signed)
Pt called and said some lady from legal finally called him and said she was gathering information.  He said she just made him frustrated, wanting to know how much the surgery was going to be an so on.  And how much his receipts were.  He said I don't care about the receipts I just want my arm fixed.  I told Daniel Lucas that Daniel Lucas from [facility] said Legal dept called twice in December and left him a message and also sent him a letter in the mail.  Pt said that was not true.  He said it is funny that the bill from [place] made it to him, but the letter did not.  I explained to Carr to go ahead and get the receipts from Dr. Posey Pronto and I would send him the receipts from here.  Pt wanted to make a diabetes follow up here for May, but he said he was not getting any labs drawn here.  He can get them draw at Dr. Antionette Char office.    He states legal department is supposed to be getting back to him.  He said it was like talking to the [place] with [facility] people.

## 2016-12-03 NOTE — Telephone Encounter (Signed)
Interesting.

## 2016-12-04 ENCOUNTER — Telehealth: Payer: Self-pay | Admitting: Family Medicine

## 2016-12-04 ENCOUNTER — Other Ambulatory Visit: Payer: Self-pay | Admitting: Family Medicine

## 2016-12-04 MED ORDER — ATORVASTATIN CALCIUM 80 MG PO TABS: 80.0000 mg | ORAL_TABLET | Freq: Every day | ORAL | 0 refills | Status: DC

## 2016-12-04 NOTE — Telephone Encounter (Signed)
Pt called and said [facility] legal department called him yesterday and said they are sending him out papers and forms for our office to complete and forms for Dr. Serita Grit office to complete.  He will bring them by at his appointment time with [facility] in May.  He states legal recommends the complete the Physical Therapy that Dr. Posey Pronto recommended.  Rosie will call Dr. Serita Grit office and tell them to set that up.   Patient is out of Atorvastatin to [place].  I will send refill as pt as follow up in May.

## 2016-12-27 ENCOUNTER — Telehealth: Payer: Self-pay | Admitting: Medical

## 2016-12-27 ENCOUNTER — Telehealth: Payer: Self-pay

## 2016-12-27 ENCOUNTER — Encounter: Payer: Self-pay | Admitting: Medical

## 2016-12-27 ENCOUNTER — Ambulatory Visit (INDEPENDENT_AMBULATORY_CARE_PROVIDER_SITE_OTHER): Payer: Commercial Managed Care - PPO | Admitting: Medical

## 2016-12-27 VITALS — BP 124/80 | HR 78 | Wt 206.0 lb

## 2016-12-27 DIAGNOSIS — Z129 Encounter for screening for malignant neoplasm, site unspecified: Secondary | ICD-10-CM

## 2016-12-27 DIAGNOSIS — Z7185 Encounter for immunization safety counseling: Secondary | ICD-10-CM | POA: Insufficient documentation

## 2016-12-27 DIAGNOSIS — I10 Essential (primary) hypertension: Secondary | ICD-10-CM | POA: Diagnosis not present

## 2016-12-27 DIAGNOSIS — Z7189 Other specified counseling: Secondary | ICD-10-CM

## 2016-12-27 DIAGNOSIS — E118 Type 2 diabetes mellitus with unspecified complications: Secondary | ICD-10-CM

## 2016-12-27 DIAGNOSIS — I2510 Atherosclerotic heart disease of native coronary artery without angina pectoris: Secondary | ICD-10-CM | POA: Diagnosis not present

## 2016-12-27 DIAGNOSIS — Z136 Encounter for screening for cardiovascular disorders: Secondary | ICD-10-CM | POA: Insufficient documentation

## 2016-12-27 DIAGNOSIS — E785 Hyperlipidemia, unspecified: Secondary | ICD-10-CM | POA: Diagnosis not present

## 2016-12-27 DIAGNOSIS — F419 Anxiety disorder, unspecified: Secondary | ICD-10-CM | POA: Diagnosis not present

## 2016-12-27 DIAGNOSIS — Z1211 Encounter for screening for malignant neoplasm of colon: Secondary | ICD-10-CM | POA: Insufficient documentation

## 2016-12-27 NOTE — Telephone Encounter (Signed)
Gave pt this information about names of psychiatry and you will not be able to give him a refill on his xanax  , he said that he knew  where he could by some xanax. He said you need stop worrying about sending him  Psychiatrist , and talk with lab about what they did to his arm. I told I did know anything about that. I was  Calling him in reference to information [name] asked to give to him.

## 2016-12-27 NOTE — Telephone Encounter (Signed)
Request from [name] to get cologuard results from Dr. Burt Knack office- after review of notes in [facility] there is no mention of cologuard order or results scanned into system. Sending to you as Daniel Lucas. Victorino December

## 2016-12-27 NOTE — Telephone Encounter (Signed)
Call him and have him possibly call one of the other offices like [facility] or [facility] or [place] to see if they can get him in sooner.  I will not be able to refill the xanax.

## 2016-12-27 NOTE — Patient Instructions (Signed)
[facility] [phone], [place], [place] [address]   [facility] [phone] office [email or web address] [address] [place], [place] [address]   Dr. Chucky May, psychiatry [phone] [address], [place], [place] [address]

## 2016-12-27 NOTE — Telephone Encounter (Signed)
Called to dr.Kaur 's office . They don't have anything sooner , she said that he is on cancellation list. And if something comes open sooner she will give him a call.

## 2016-12-27 NOTE — Progress Notes (Signed)
Subjective:     Patient ID: Daniel Lucas, male   DOB: 1955-12-13, 61 y.o.   MRN: [id number]  HPI Diabetes -  Last f/u almost a year ago.  Compliant with Metformin '500mg'$  BID, checking glucose somewhat regularly, ranges from 90s-120s.  No hypoglycemia, no other symptoms.    Stress will run his sugars up.  Exercise - walks, still works as a Dealer.  Diet is healthy, checks feet.  hasn't seen eye doctor in the past year.   Hypertension - compliant with coreg 6.'25mg'$  BID, Lisinopril '10mg'$  daily.  Hyperlipidemia - compliant with Lipitor '80mg'$  QHS, Aspirin '81mg'$  QHS  CAD - last cardiology visit 12/2015.  Has appt this month.   Leukocytosis - last labs about a year ago.  Anxiety - has bad panic attacks, takes xanax.   Had been seeing Dr. Letta Lucas, but Dr. Caprice Lucas closed her practice in 07/2017.  She wanted him to transfer to [facility].  Dr. Caprice Lucas gave him 36mosupply , and he notes he has been on the same medication since the 80s.   He is almost out of medication.  Requesting me to refill medications (xanax QID) until he sees Dr. KToy Lucas   Arm/nerve injury- he is frustrated with [facility], says they have drug their feet on communication and resolving his issue with the lab draw injury from this past year.  Review of Systems    Past Medical History:  Diagnosis Date  . Anxiety   . CAD (coronary artery disease)    a. 10/2012: inferolateral wall ST elevation MI s/p stent to distal LCx 10/29/12 (Stentys Apposition Trial randomizing him to a self-expanding bare-metal stent versus a traditional balloon expandable bare-metal coronary stent).   . Diabetes mellitus ([place]   . Former smoker   . Hyperlipidemia   . Hypertension   . Leukocytosis   . Polycythemia    a. patient reports hx of therapeutic phlebotomy in the 1970s => HCT eventually stabilized and he no longer required phlebotomy   Current Outpatient Prescriptions on File Prior to Visit  Medication Sig Dispense Refill  . ALPRAZolam  (XANAX) 0.5 MG tablet Take 0.5 mg by mouth 4 (four) times daily as needed. Reported on 03/01/2016    . aspirin 81 MG tablet Take 1 tablet (81 mg total) by mouth daily. 90 tablet 3  . atorvastatin (LIPITOR) 80 MG tablet Take 1 tablet (80 mg total) by mouth daily. 90 tablet 0  . Blood Glucose Monitoring Suppl ([place] w/Device KIT 1 Stick by Other route daily. 1 kit 0  . carvedilol (COREG) 6.25 MG tablet Take 1 tablet (6.25 mg total) by mouth 2 (two) times daily with a meal. 180 tablet 3  . glucose blood (ONE TOUCH TEST STRIPS) test strip Use as instructed 100 each 1  . lisinopril (PRINIVIL) 10 MG tablet Take 1 tablet (10 mg total) by mouth daily. 90 tablet 3  . metFORMIN (GLUCOPHAGE) 500 MG tablet Take 1 tablet (500 mg total) by mouth 2 (two) times daily with a meal. 1 tablet po BID for diabetes 180 tablet 3  . nitroGLYCERIN (NITROSTAT) 0.4 MG SL tablet Place 1 tablet (0.4 mg total) under the tongue every 5 (five) minutes as needed for chest pain (up to 3 doses). 25 tablet 2  . ONETOUCH DELICA LANCETS FINE MISC Use as instructed 100 each 0   No current facility-administered medications on file prior to visit.         Objective:   Physical Exam  BP 124/80   Pulse 78   Wt 206 lb (93.4 kg)   SpO2 98%   BMI 27.18 kg/m   General appearance: alert, no distress, WD/WN Oral cavity: MMM, no lesions Neck: supple, no lymphadenopathy, no thyromegaly, no masses, no bruits Heart: RRR, normal S1, S2, no murmurs Lungs: CTA bilaterally, no wheezes, rhonchi, or rales Pulses: 2+ symmetric, upper and lower extremities, normal cap refill Ext: no edema  Diabetic Foot Exam - Simple   Simple Foot Form Diabetic Foot exam was performed with the following findings:  Yes 12/27/2016  8:55 AM  Visual Inspection No deformities, no ulcerations, no other skin breakdown bilaterally:  Yes Sensation Testing Intact to touch and monofilament testing bilaterally:  Yes Pulse Check Posterior Tibialis  and Dorsalis pulse intact bilaterally:  Yes Comments         Assessment:     Encounter Diagnoses  Name Primary?  . Diabetes mellitus with complication in adult patient ([place]) Yes  . Hyperlipidemia, unspecified hyperlipidemia type   . Essential hypertension   . Atherosclerosis of native coronary artery of native heart without angina pectoris   . Anxiety   . Vaccine counseling   . Screening for cancer        Plan:     Diabetes - c/t same medications, he will get labs done elsewhere, gave lab orders and glucometer testing supplies scripts/orders, discussed healthy diet, exercise, and discussed f/u at least q47mo HTN - c/t same medications  hyperlipidemia - c/t same medications  CAD - f/u this month with cardiology  Discussed cancer screening - prostate, colon.  He reportedly had Cologuard done through his cardiologist.  We will request record.  I recommend you have a shingles vaccine to help prevent shingles or herpes zoster outbreak.   Please call your insurer to inquire about coverage for the Shingrix vaccine given in 2 doses.   Some insurers cover this vaccine after age 63 some cover this after age 63  If your insurer covers this, then call to schedule appointment to have this vaccine here.  Advised yearly flu shot  PSA lab with orders today for screening  Regarding nerve injury - advised he continue to coordinate between [facility] and his attorney.  LDondrellwas seen today for dm check.  Diagnoses and all orders for this visit:  Diabetes mellitus with complication in adult patient ([place] -     Hemoglobin A1c; Future -     HM DIABETES EYE EXAM -     HM DIABETES FOOT EXAM -     Microalbumin / creatinine urine ratio; Future  Hyperlipidemia, unspecified hyperlipidemia type -     Lipid panel; Future  Essential hypertension -     Comprehensive metabolic panel; Future -     Lipid panel; Future -     CBC; Future  Atherosclerosis of native coronary artery of native  heart without angina pectoris  Anxiety  Vaccine counseling  Screening for cancer -     PSA; Future

## 2016-12-27 NOTE — Telephone Encounter (Signed)
He used to see Dr. Caprice Beaver, but she recommended he see Dr. Toy Care. He notes that he can't get in to see her until late August and is almost out of medication for anxiety.   Call and see if they can get him in within the next 30 days with Dr. Toy Care.  August is ridiculous.  I am not willing to refill his psychiatric medications.

## 2016-12-27 NOTE — Telephone Encounter (Signed)
Call [facility] and see if there is a result.  He says he did one.

## 2016-12-27 NOTE — Telephone Encounter (Signed)
Called [facility] is not able to provide this information, as we do not know original ordering physician.

## 2017-01-01 ENCOUNTER — Encounter: Payer: Self-pay | Admitting: Physician Assistant

## 2017-01-08 ENCOUNTER — Telehealth: Payer: Self-pay | Admitting: Family Medicine

## 2017-01-08 DIAGNOSIS — I2510 Atherosclerotic heart disease of native coronary artery without angina pectoris: Secondary | ICD-10-CM

## 2017-01-08 MED ORDER — ATORVASTATIN CALCIUM 80 MG PO TABS: 80.0000 mg | ORAL_TABLET | Freq: Every day | ORAL | 0 refills | Status: DC

## 2017-01-08 MED ORDER — METFORMIN HCL 500 MG PO TABS: 500.0000 mg | ORAL_TABLET | Freq: Two times a day (BID) | ORAL | 0 refills | Status: DC

## 2017-01-08 NOTE — Telephone Encounter (Signed)
Pt called he is almost out of Lipitor or Metformin.  He is not sure which.  He states he is at work and it takes him 22 miles to get to [employer].  He would like to be able to pick up both his meds at the same [place].  His insurance will not pay if he gets any early.  Pt will be having his labs drawn at Dr Antionette Char on May 31st.  He has a script from [facility] for the labs we need drawn for here.  Refilled both with zero refills until labs are returned.

## 2017-01-13 ENCOUNTER — Telehealth: Payer: Self-pay | Admitting: Family Medicine

## 2017-01-13 NOTE — Telephone Encounter (Signed)
Received emailed letter for patient from [facility].  He states they say they mailed it to him, and he hasn't received. So they sent here. Called pt advised him we have letter.  I will send him copy of letter and copy of records that [facility] is requesting. Pt aware and agrees.

## 2017-01-21 NOTE — Progress Notes (Addendum)
Cardiology Office Note    Date:  01/23/2017   ID:  Daniel Lucas, DOB 1956-08-10, MRN [id number]  PCP:  Carlena Hurl, PA-C  Cardiologist:  Dr. Burt Knack  Chief Complaint: yearly follow up for CAD  History of Present Illness:   Daniel Lucas is a 61 y.o. male with hx of CAD, HTN,  HLD and DM presents for yearly follow up.   Hx of  STEMI 10/2012. Cardiac catheterization demonstrated multivessel disease, but his left circumflex was the culprit vessel with acute total occlusion. He was treated with a bare-metal stent platform through the Stentys Apposition Trial. His post myocardial infarction left ventricular function was well-preserved with the exception of inferobasal hypokinesis. The LVEF was 55%.  He was doing on cardiac stand point when last seen by Dr. Burt Knack 01/24/16.  Here today for follow up.  He walks 45 minutes every day without chest pain or shortness of breath. He denies orthopnea, PND, syncope, lower extremity edema, palpitation, melena or blood in his stool or urine. He is really upset that his primary care does not feeling his Xanax 0.'5mg'$  4 times a day which he takes for the past 30 years. He takes Xanax panic attack. He has a appointment with psychiatric for evaluation in August.   Past Medical History:  Diagnosis Date  . Anxiety   . CAD (coronary artery disease)    a. 10/2012: inferolateral wall ST elevation MI s/p stent to distal LCx 10/29/12 (Stentys Apposition Trial randomizing him to a self-expanding bare-metal stent versus a traditional balloon expandable bare-metal coronary stent).   . Diabetes mellitus ([place])   . Former smoker   . Hyperlipidemia   . Hypertension   . Leukocytosis   . Polycythemia    a. patient reports hx of therapeutic phlebotomy in the 1970s => HCT eventually stabilized and he no longer required phlebotomy    Past Surgical History:  Procedure Laterality Date  . LEFT HEART CATHETERIZATION WITH CORONARY ANGIOGRAM N/A 10/29/2012   Procedure:  LEFT HEART CATHETERIZATION WITH CORONARY ANGIOGRAM;  Surgeon: Sherren Mocha, MD;  Location: [facility] CATH LAB;  Service: Cardiovascular;  Laterality: N/A;  . none          Current Medications: Prior to Admission medications   Medication Sig Start Date End Date Taking? Authorizing Provider  ALPRAZolam Duanne Moron) 0.5 MG tablet Take 0.5 mg by mouth 4 (four) times daily as needed. Reported on 03/01/2016    [provider]  aspirin 81 MG tablet Take 1 tablet (81 mg total) by mouth daily. 02/09/16   Tysinger, Camelia Eng, PA-C  atorvastatin (LIPITOR) 80 MG tablet Take 1 tablet (80 mg total) by mouth daily. 01/08/17   Tysinger, Camelia Eng, PA-C  Blood Glucose Monitoring Suppl (ONETOUCH VERIO FLEX SYSTEM) w/Device KIT 1 Stick by Other route daily. 10/14/16   Tysinger, Camelia Eng, PA-C  carvedilol (COREG) 6.25 MG tablet Take 1 tablet (6.25 mg total) by mouth 2 (two) times daily with a meal. 01/24/16   Sherren Mocha, MD  glucose blood (ONE [facility] TEST STRIPS) test strip Use as instructed 10/14/16   Tysinger, Camelia Eng, PA-C  lisinopril (PRINIVIL) 10 MG tablet Take 1 tablet (10 mg total) by mouth daily. 01/24/16   Sherren Mocha, MD  metFORMIN (GLUCOPHAGE) 500 MG tablet Take 1 tablet (500 mg total) by mouth 2 (two) times daily with a meal. 1 tablet po BID for diabetes 01/08/17   Tysinger, Camelia Eng, PA-C  nitroGLYCERIN (NITROSTAT) 0.4 MG SL tablet Place 1 tablet (  0.4 mg total) under the tongue every 5 (five) minutes as needed for chest pain (up to 3 doses). 01/24/16   Tonny Bollman, MD  [facility] LANCETS FINE MISC Use as instructed 10/14/16   Tysinger, Kermit Balo, PA-C    Allergies:   Patient has no known allergies.   Social History   Social History  . Marital status: Married    Spouse name: N/A  . Number of children: N/A  . Years of education: N/A   Occupational History  . Journalist, newspaper    Social History Main Topics  . Smoking status: Former Smoker    Packs/day: 1.00    Years: 30.00    Quit date:  06/16/2012  . Smokeless tobacco: Never Used  . Alcohol use No  . Drug use: No  . Sexual activity: Not Asked   Other Topics Concern  . None   Social History Narrative   Married, lives with his wife in a 2 story home.  At least one grandfather had a history of coronary artery disease but no CAD in any first-degree relatives.   Works as a Teaching laboratory technician.  Has 3 children.  Education: high school.     Family History:  The patient's family history includes CAD in his brother, father, and mother; Diabetes Mellitus II in his brother and sister; Stroke in his mother. He was adopted.   ROS:   Please see the history of present illness.    ROS All other systems reviewed and are negative.   PHYSICAL EXAM:   VS:  BP 128/72 (BP Location: Left Arm)   Pulse (!) 56   Ht 6\' 2"  (1.88 m)   Wt 202 lb 6.4 oz (91.8 kg)   BMI 25.99 kg/m    GEN: Well nourished, well developed, in no acute distress  HEENT: normal  Neck: no JVD, carotid bruits, or masses Cardiac:RRR; no murmurs, rubs, or gallops,no edema  Respiratory:  clear to auscultation bilaterally, normal work of breathing GI: soft, nontender, nondistended, + BS MS: no deformity or atrophy  Skin: warm and dry, no rash Neuro:  Alert and Oriented x 3, Strength and sensation are intact Psych: euthymic mood, full affect  Wt Readings from Last 3 Encounters:  01/23/17 202 lb 6.4 oz (91.8 kg)  12/27/16 206 lb (93.4 kg)  05/28/16 204 lb 5 oz (92.7 kg)      Studies/Labs Reviewed:   EKG:  EKG is ordered today.  The ekg ordered today demonstrates Sinus bradycardia at rate of 56 bpm.  Recent Labs: 02/08/2016: ALT 26; BUN 14; Creat 0.90; Potassium 5.1; Sodium 141   Lipid Panel    Component Value Date/Time   CHOL 135 08/08/2015 0001   TRIG 134 08/08/2015 0001   HDL 36 (L) 08/08/2015 0001   CHOLHDL 3.8 08/08/2015 0001   VLDL 27 08/08/2015 0001   LDLCALC 72 08/08/2015 0001    Additional studies/ records that were reviewed today include:    Echocardiogram: 10/2012 Study Conclusions  - Left ventricle: Possible mild inferobasal hypokinesis The cavity size was normal. Systolic function was normal. The estimated ejection fraction was 55%. Wall motion was normal; there were no regional wall motion abnormalities. - Atrial septum: No defect or patent foramen ovale was identified.  Cardiac Catheterization: 10/2012 Coronary angiography: Coronary dominance: right  Left mainstem: Patent with no significant stenosis  Left anterior descending (LAD): The LAD is patent. The vessel reaches the left ventricular apex. There is mild diffuse disease and some mild intramyocardial bridging in  the mid to distal vessel. The first diagonal has a high origin and has her to 40% segmental plaquing.  Left circumflex (LCx): The left circumflex is initially patent. There is a large caliber intermediate branch. The distal circumflex is totally occluded with the angiographic appearance of an acute occlusion.  Right coronary artery (RCA): The RCA is dominant. It relatively small caliber vessel. There is 70-75% mid RCA stenosis present. The proximal and distal RCA are widely patent. The PDA and PLA branches are patent.  Left ventriculography: There is focal akinesis of the mid inferior wall. The estimated left ventricular ejection fraction is 45-50%.  PCI Data: Vessel - left circumflex/Segment - distal Percent Stenosis (pre)  100 TIMI-flow 0 Stent 2.5-3.0 x 22x2, overlapping Percent Stenosis (post) 0 TIMI-flow (post) 3  Final Conclusions:   1. Acute inferolateral myocardial infarction secondary to total occlusion left circumflex with successful primary PCI as detailed above 2. Moderate mid RCA stenosis 3. Minor nonobstructive LAD stenosis 4. Mild segmental LV dysfunction  Recommendations: Post MI medical therapy, dual antiplatelet therapy with aspirin and effient, tobacco cessation.   ASSESSMENT & PLAN:    1. CAD s/p  stenting to distal LCx 3/14 ( Stentys Apposition Trial randomizing him to BMS vs DES) - No anginaOr dyspnea. Continue aspirin, Lipitor, Coreg and lisinopril.  2. HTN - Stable and well controlled on current regimen.  3. HLD - Continue statin. Will check labs.  4. DM - Per PCP  5. Anxiety/panic attack - He takes 4 times a day chronically for the past 30 years. Advise refill request by PCP. Has upcoming appointment with psychiatric.    He has bring prescription for labs that was ordered by PCP. We will get TODAY and forwards to PCP. The patient was very upset that I have not prescribed Xanax. Will give prescription of Viagra for sexual dysfunction.   Medication Adjustments/Labs and Tests Ordered: Current medicines are reviewed at length with the patient today.  Concerns regarding medicines are outlined above.  Medication changes, Labs and Tests ordered today are listed in the Patient Instructions below. Patient Instructions  Medication Instructions:  Your physician recommends that you continue on your current medications as directed. Please refer to the Current Medication list given to you today.   Labwork: TODAY:  CMET, CBC, PSA, HGBA1C, MICRO/CRE RATIO, & LIPID  Testing/Procedures: None ordered  Follow-Up: Your physician wants you to follow-up in: [place] will receive a reminder letter in the mail two months in advance. If you don't receive a letter, please call our office to schedule the follow-up appointment.   Any Other Special Instructions Will Be Listed Below (If Applicable).     If you need a refill on your cardiac medications before your next appointment, please call your pharmacy.      Jarrett Soho, [place]  01/23/2017 9:57 AM    [facility] [address], [place], [place]  [address] Phone: [phone]; Fax: [phone]

## 2017-01-23 ENCOUNTER — Telehealth: Payer: Self-pay | Admitting: Family Medicine

## 2017-01-23 ENCOUNTER — Encounter: Payer: Self-pay | Admitting: Physician Assistant

## 2017-01-23 ENCOUNTER — Ambulatory Visit (INDEPENDENT_AMBULATORY_CARE_PROVIDER_SITE_OTHER): Payer: Commercial Managed Care - PPO | Admitting: Physician Assistant

## 2017-01-23 VITALS — BP 128/72 | HR 56 | Ht 74.0 in | Wt 202.4 lb

## 2017-01-23 DIAGNOSIS — I2510 Atherosclerotic heart disease of native coronary artery without angina pectoris: Secondary | ICD-10-CM | POA: Diagnosis not present

## 2017-01-23 DIAGNOSIS — E118 Type 2 diabetes mellitus with unspecified complications: Secondary | ICD-10-CM | POA: Diagnosis not present

## 2017-01-23 DIAGNOSIS — I10 Essential (primary) hypertension: Secondary | ICD-10-CM

## 2017-01-23 DIAGNOSIS — E785 Hyperlipidemia, unspecified: Secondary | ICD-10-CM

## 2017-01-23 DIAGNOSIS — Z129 Encounter for screening for malignant neoplasm, site unspecified: Secondary | ICD-10-CM | POA: Diagnosis not present

## 2017-01-23 MED ORDER — NITROGLYCERIN 0.4 MG SL SUBL: 0.4000 mg | SUBLINGUAL_TABLET | SUBLINGUAL | 3 refills | Status: DC | PRN

## 2017-01-23 MED ORDER — SILDENAFIL CITRATE 50 MG PO TABS: 50.0000 mg | ORAL_TABLET | Freq: Every day | ORAL | 0 refills | Status: DC | PRN

## 2017-01-23 MED ORDER — LISINOPRIL 10 MG PO TABS: 10.0000 mg | ORAL_TABLET | Freq: Every day | ORAL | 3 refills | Status: DC

## 2017-01-23 MED ORDER — CARVEDILOL 6.25 MG PO TABS: 6.2500 mg | ORAL_TABLET | Freq: Two times a day (BID) | ORAL | 3 refills | Status: DC

## 2017-01-23 MED ORDER — ATORVASTATIN CALCIUM 80 MG PO TABS: 80.0000 mg | ORAL_TABLET | Freq: Every day | ORAL | 3 refills | Status: DC

## 2017-01-23 NOTE — Addendum Note (Signed)
Addended by: Gaetano Net on: 01/23/2017 10:32 AM   Modules accepted: Orders

## 2017-01-23 NOTE — Addendum Note (Signed)
Addended by: Gaetano Net on: 01/23/2017 09:55 AM   Modules accepted: Orders

## 2017-01-23 NOTE — Patient Instructions (Signed)
Medication Instructions:  Your physician recommends that you continue on your current medications as directed. Please refer to the Current Medication list given to you today.   Labwork: TODAY:  CMET, CBC, PSA, HGBA1C, MICRO/CRE RATIO, & LIPID  Testing/Procedures: None ordered  Follow-Up: Your physician wants you to follow-up in: [place] will receive a reminder letter in the mail two months in advance. If you don't receive a letter, please call our office to schedule the follow-up appointment.   Any Other Special Instructions Will Be Listed Below (If Applicable).     If you need a refill on your cardiac medications before your next appointment, please call your pharmacy.

## 2017-01-23 NOTE — Telephone Encounter (Signed)
Pt called and said he went to see Dr. Burt Knack today and they sent in a PA that did not speak good english and he could not half understand him.  Then they sent him to the lab.  They lost the lab order [name] had written, but the lab tech went ahead and drew labs.    I reviewed the visit and it appears they must have found the orders and I explained same to the pt.  Pt said he was so frustrated with the PA there that he walked out of the room.  He states Dr. Burt Knack is refilling the same meds that we are refilling.  I reviewed and Dr. Burt Knack has given him a years refill on several meds.  I explained to stick with that since they had given him a year rx.  Lab tech could not get blood out of his "damaged arm" and lab tech said maybe the vein is damaged and had to get blood out of the other arm.  Pt wants copy of labs mailed to his home.  Also, pt did receive the letter I forwarded to him from [place].

## 2017-01-24 LAB — CBC
Hematocrit: 45 % (ref 37.5–51.0)
Hemoglobin: 15.5 g/dL (ref 13.0–17.7)
MCH: 31.7 pg (ref 26.6–33.0)
MCHC: 34.4 g/dL (ref 31.5–35.7)
MCV: 92 fL (ref 79–97)
PLATELETS: 335 10*3/uL (ref 150–379)
RBC: 4.89 x10E6/uL (ref 4.14–5.80)
RDW: 13.4 % (ref 12.3–15.4)
WBC: 9.1 10*3/uL (ref 3.4–10.8)

## 2017-01-24 LAB — HEMOGLOBIN A1C
Est. average glucose Bld gHb Est-mCnc: 171 mg/dL
HEMOGLOBIN A1C: 7.6 % — AB (ref 4.8–5.6)

## 2017-01-24 LAB — LIPID PANEL
CHOLESTEROL TOTAL: 118 mg/dL (ref 100–199)
Chol/HDL Ratio: 3.8 ratio (ref 0.0–5.0)
HDL: 31 mg/dL — AB (ref >39)
LDL Calculated: 56 mg/dL (ref 0–99)
Triglycerides: 155 mg/dL — ABNORMAL HIGH (ref 0–149)
VLDL Cholesterol Cal: 31 mg/dL (ref 5–40)

## 2017-01-24 LAB — COMPREHENSIVE METABOLIC PANEL
ALK PHOS: 52 IU/L (ref 39–117)
ALT: 23 IU/L (ref 0–44)
AST: 20 IU/L (ref 0–40)
Albumin/Globulin Ratio: 2.1 (ref 1.2–2.2)
Albumin: 4.8 g/dL (ref 3.6–4.8)
BILIRUBIN TOTAL: 0.6 mg/dL (ref 0.0–1.2)
BUN/Creatinine Ratio: 11 (ref 10–24)
BUN: 10 mg/dL (ref 8–27)
CHLORIDE: 101 mmol/L (ref 96–106)
CO2: 19 mmol/L (ref 18–29)
Calcium: 9.6 mg/dL (ref 8.6–10.2)
Creatinine, Ser: 0.94 mg/dL (ref 0.76–1.27)
GFR calc Af Amer: 100 mL/min/{1.73_m2} (ref >59)
GFR calc non Af Amer: 87 mL/min/{1.73_m2} (ref >59)
GLUCOSE: 140 mg/dL — AB (ref 65–99)
Globulin, Total: 2.3 g/dL (ref 1.5–4.5)
Potassium: 4.5 mmol/L (ref 3.5–5.2)
Sodium: 139 mmol/L (ref 134–144)
Total Protein: 7.1 g/dL (ref 6.0–8.5)

## 2017-01-24 LAB — PSA: PROSTATE SPECIFIC AG, SERUM: 1 ng/mL (ref 0.0–4.0)

## 2017-01-29 ENCOUNTER — Other Ambulatory Visit: Payer: Self-pay | Admitting: Medical

## 2017-01-29 ENCOUNTER — Other Ambulatory Visit: Payer: Self-pay

## 2017-01-29 MED ORDER — ASPIRIN 81 MG PO TABS: 81.0000 mg | ORAL_TABLET | Freq: Every day | ORAL | 3 refills | Status: DC

## 2017-01-29 MED ORDER — SITAGLIPTIN-METFORMIN HCL 50-1000 MG PO TABS
1.0000 | ORAL_TABLET | Freq: Two times a day (BID) | ORAL | 1 refills | Status: DC
Start: 2017-01-29 — End: 2017-04-08

## 2017-01-29 NOTE — Telephone Encounter (Signed)
Forwarding

## 2017-01-29 NOTE — Telephone Encounter (Signed)
Labs were ok except diabetes marker too high.  C/t all medications except STOP Metformin.  Change to Janumet twice daily which contains metformin and another medication to help control blood sugars.   Eat a healthy low fast low carb diet, exercise regularly.  F/u 73mo fasting.Marland Kitchen

## 2017-01-30 NOTE — Telephone Encounter (Signed)
I have not called the patient yet, as I am concerned with his meds, possible duplicate providers treating.  Please review what Dr. Burt Knack is calling in and his lab notes and compare and advise.  Since patient had earlier advised Dr. Burt Knack had called in meds for a year.  Didn't know if you were stopping something he just filled and so on.

## 2017-01-31 NOTE — Telephone Encounter (Signed)
[name], the only recent medication I changed was Metformin to Janumet for diabetes.    I wonder if you are referring to Xanax.  It appears he got upset that cardiology wouldn't refill Xanax.  I told him I would not as well.   He needs to see psychiatry and counseling for anxiety.   I don't think Xanax 4 times daily makes sense.

## 2017-02-04 ENCOUNTER — Encounter: Payer: Self-pay | Admitting: Family Medicine

## 2017-02-04 NOTE — Telephone Encounter (Signed)
Called pt today, reached his voice mail, lmtrc.  Sent letter with instructions and copy of labs as patient had requested a copy.

## 2017-02-12 ENCOUNTER — Telehealth: Payer: Self-pay | Admitting: Family Medicine

## 2017-02-12 NOTE — Telephone Encounter (Signed)
Pt had called and left message.  I called pt back reached his voice mail.

## 2017-02-13 NOTE — Telephone Encounter (Signed)
Ret call to pt, reached voice mail again, advised letter sent out with directions on new meds.  Left message to call us and make appt for 3 months.

## 2017-02-19 ENCOUNTER — Telehealth: Payer: Self-pay | Admitting: Family Medicine

## 2017-02-19 NOTE — Telephone Encounter (Signed)
Talked with patient, explained everything to him.  He states sometimes when he works he doesn't have his phone. He will go pick up the Janumet and after taking 3 months come in here for a follow up.  I called [facility], they had put the med back on shelf and was not out.  They will have tomorrow evening. Called pt back advised same.  Pt also wants info on carbs.  Will send same.

## 2017-02-24 ENCOUNTER — Telehealth: Payer: Self-pay | Admitting: Family Medicine

## 2017-02-24 NOTE — Telephone Encounter (Signed)
Forward to [place]

## 2017-02-24 NOTE — Telephone Encounter (Signed)
Have him call insurance in reference to alternatives to Janumet.  Similar drugs that may be cheaper would include Kombilgyze, Jentadueto

## 2017-02-24 NOTE — Telephone Encounter (Signed)
Called pt and he will check with his insurance and see which one they will cover and call us back.

## 2017-02-24 NOTE — Telephone Encounter (Signed)
Pt called, he went to pick up the new medicine and it is 320.00 for 30 days, he cannot afford that.  He states he will just work harder on his diet and exercise.  Is there something cheaper?  Also pt has sent all his info to [facility] regarding his arm.

## 2017-04-07 ENCOUNTER — Telehealth: Payer: Self-pay | Admitting: Family Medicine

## 2017-04-07 ENCOUNTER — Other Ambulatory Visit: Payer: Self-pay

## 2017-04-07 NOTE — Telephone Encounter (Signed)
You sent in his DM meds 6/18 # 180 with 1 refiil

## 2017-04-07 NOTE — Telephone Encounter (Signed)
pls refill metformin and schedule f/u as he is due for 48mo diabetes f/u

## 2017-04-07 NOTE — Telephone Encounter (Signed)
Pt left me a message that he finally heard back from the insurance company regarding his medication and asked me to call him.  I called him.  Reached voice mail.  lmtrc.

## 2017-04-07 NOTE — Telephone Encounter (Signed)
Pt called back and said he is now working well with Metformin and his highest number has been 112.  He is exercising and watching his foods.  Several times in the 95's  He is almost out of Metformin, call in refill to [facility], if you want him to stay on this meds.  Please advise pt.

## 2017-04-07 NOTE — Telephone Encounter (Signed)
Sent med in left message for pt to call back

## 2017-04-07 NOTE — Telephone Encounter (Signed)
Ok,  But he still needs f/u appt

## 2017-04-08 ENCOUNTER — Telehealth: Payer: Self-pay | Admitting: Family Medicine

## 2017-04-08 ENCOUNTER — Other Ambulatory Visit: Payer: Self-pay | Admitting: Medical

## 2017-04-08 MED ORDER — METFORMIN HCL 500 MG PO TABS: 500.0000 mg | ORAL_TABLET | Freq: Three times a day (TID) | ORAL | 1 refills | Status: DC

## 2017-04-08 NOTE — Telephone Encounter (Signed)
Pt called frustrated because he said he was only supposed to come back in after he had started the [place] and he never started because he could not afford it.  He called his insurance over a month ago and they just told him just keep having different rx written until he found something he could afford.  He called insurance back again. And they told him if his numbers were coming down and he could afford the Metformin to stay on that.  So he wants to stay on the Metformin.  Pt said he is exercising and eating correctly now and his numbers are looking good.  He didn't understand why he still needed to come in.  I explained we see our diabetics every 3, 4 or 6 months according to how well they are controlled.  I explained if he was doing well that may extend the next follow up visit to further out.  Pt is very frustrated with everything that is going on.  He states I am the only one that will listen to him.  No one else does.  Randell Loop is still running him around about his arm.  He is on short time at work.  He states no one cares.  He asked if he switched docs could he get his records and I advised him yes.    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  Patient will call back this month or next for follow up, he is aware Metformin was sent to [facility] drug with one refill.

## 2017-04-08 NOTE — Telephone Encounter (Signed)
Pt called back and was not very nice he states he do sent need an appointment until OCT that he was on short time due to coming back and forth from the Dr. He has not been in since 12/27/16 I tried to explain about our DM appointments that the DM pt come in every 3 to 4 months unless they are controled then it is every 6 months he continued to want to argue I asked what med he was taking and how he was taking it in his med's in his chart showed he was on janmet but he states he couldn't afford he then told me Metformin 500 mg tid worked best for him he said he has been talking with [name] about this so I have gone to [place] and we have gotten with [name] and got things worked out now pt is talking with [name]

## 2017-04-08 NOTE — Telephone Encounter (Signed)
I don't understand the comment about his attorney?   Like many diabetics, we monitor labs q46mo typically unless numbers are very stable.   In his case, if he wasn't able to start [place] and went back to Metformin, he is still due for f/u at this time.    So please schedule at his convenience

## 2017-04-09 NOTE — Telephone Encounter (Signed)
Pt was advised to schedule this month or next.

## 2017-07-09 ENCOUNTER — Ambulatory Visit: Payer: Commercial Managed Care - PPO | Admitting: Medical

## 2017-07-09 ENCOUNTER — Encounter: Payer: Self-pay | Admitting: Medical

## 2017-07-09 VITALS — BP 138/88 | HR 87 | Temp 98.6°F | Wt 197.6 lb

## 2017-07-09 DIAGNOSIS — Z87891 Personal history of nicotine dependence: Secondary | ICD-10-CM

## 2017-07-09 DIAGNOSIS — E118 Type 2 diabetes mellitus with unspecified complications: Secondary | ICD-10-CM

## 2017-07-09 DIAGNOSIS — R0989 Other specified symptoms and signs involving the circulatory and respiratory systems: Secondary | ICD-10-CM | POA: Diagnosis not present

## 2017-07-09 DIAGNOSIS — Z7189 Other specified counseling: Secondary | ICD-10-CM

## 2017-07-09 DIAGNOSIS — I10 Essential (primary) hypertension: Secondary | ICD-10-CM

## 2017-07-09 DIAGNOSIS — I2510 Atherosclerotic heart disease of native coronary artery without angina pectoris: Secondary | ICD-10-CM | POA: Diagnosis not present

## 2017-07-09 DIAGNOSIS — J988 Other specified respiratory disorders: Secondary | ICD-10-CM | POA: Diagnosis not present

## 2017-07-09 DIAGNOSIS — E785 Hyperlipidemia, unspecified: Secondary | ICD-10-CM

## 2017-07-09 DIAGNOSIS — Z1211 Encounter for screening for malignant neoplasm of colon: Secondary | ICD-10-CM | POA: Diagnosis not present

## 2017-07-09 DIAGNOSIS — Z7185 Encounter for immunization safety counseling: Secondary | ICD-10-CM

## 2017-07-09 DIAGNOSIS — E119 Type 2 diabetes mellitus without complications: Secondary | ICD-10-CM | POA: Diagnosis not present

## 2017-07-09 LAB — POCT GLYCOSYLATED HEMOGLOBIN (HGB A1C): Hemoglobin A1C: 6.7

## 2017-07-09 MED ORDER — METFORMIN HCL 500 MG PO TABS: 500.0000 mg | ORAL_TABLET | Freq: Three times a day (TID) | ORAL | 3 refills | Status: DC

## 2017-07-09 NOTE — Patient Instructions (Addendum)
For cold symptoms Use either Mucinex DM or Coricidin HBP for cough/congestion  Avoid any over the counter "decongestants" such as sudafed, pseudoephedrine or phenylephrine as they can raise the blood pressure  If you don't feel much improved by Friday or Monday, or if wworsening call back  Continue your current medications    I recommend you have a shingles vaccine to help prevent shingles or herpes zoster outbreak.   Please call your insurer to inquire about coverage for the Shingrix vaccine given in 2 doses.   Some insurers cover this vaccine after age 59, some cover this after age 25.  If your insurer covers this, then call to schedule appointment to have this vaccine here.  We will refer you for Cologard test  Let me know when you are ready to do leg blood pressure studies/ABIs

## 2017-07-09 NOTE — Progress Notes (Signed)
Subjective: Chief Complaint  Patient presents with  . Diabetes    dm check , cold   Here for chronic disease follow up.  Diabetes - compliant with metformin '500mg'$  TID.   Been eating healthier,exercising, eats green beans instead of french fires  He has had 3 day hx/o cough, congestion," head cold symptoms".   No fever, no wheezing or SOB.   Using OTC remedy  Denies chest pain, DOE.  Compliant with medications for BP and cholesterol.     Quit smoking 6 years  No prior shingles shot  Had recent laceration left lateral hand/wrist region  Past Medical History:  Diagnosis Date  . Anxiety   . CAD (coronary artery disease)    a. 10/2012: inferolateral wall ST elevation MI s/p stent to distal LCx 10/29/12 (Stentys Apposition Trial randomizing him to a self-expanding bare-metal stent versus a traditional balloon expandable bare-metal coronary stent).   . Diabetes mellitus ([place])   . Former smoker   . Hyperlipidemia   . Hypertension   . Leukocytosis   . Polycythemia    a. patient reports hx of therapeutic phlebotomy in the 1970s => HCT eventually stabilized and he no longer required phlebotomy   Current Outpatient Medications on File Prior to Visit  Medication Sig Dispense Refill  . ALPRAZolam (XANAX) 0.5 MG tablet Take 0.5 mg by mouth 4 (four) times daily as needed. Reported on 03/01/2016    . aspirin 81 MG tablet Take 1 tablet (81 mg total) by mouth daily. 90 tablet 3  . atorvastatin (LIPITOR) 80 MG tablet Take 1 tablet (80 mg total) by mouth daily. 90 tablet 3  . Blood Glucose Monitoring Suppl ([place]) w/Device KIT 1 Stick by Other route daily. 1 kit 0  . carvedilol (COREG) 6.25 MG tablet Take 1 tablet (6.25 mg total) by mouth 2 (two) times daily with a meal. 180 tablet 3  . glucose blood (ONE TOUCH TEST STRIPS) test strip Use as instructed 100 each 1  . lisinopril (PRINIVIL) 10 MG tablet Take 1 tablet (10 mg total) by mouth daily. 90 tablet 3  . nitroGLYCERIN  (NITROSTAT) 0.4 MG SL tablet Place 1 tablet (0.4 mg total) under the tongue every 5 (five) minutes as needed for chest pain (up to 3 doses). 25 tablet 3  . ONETOUCH DELICA LANCETS FINE MISC Use as instructed 100 each 0  . sildenafil (VIAGRA) 50 MG tablet Take 1 tablet (50 mg total) by mouth daily as needed for erectile dysfunction. 10 tablet 0   No current facility-administered medications on file prior to visit.    ROS as in subjective   Objective: BP 138/88   Pulse 87   Temp 98.6 F (37 C)   Wt 197 lb 9.6 oz (89.6 kg)   SpO2 97%   BMI 25.37 kg/m   General appearance: alert, no distress, WD/WN,  HEENT: normocephalic, sclerae anicteric, TMs pearly, nares patent, no discharge or erythema, pharynx normal Oral cavity: MMM, no lesions Neck: supple, no lymphadenopathy, no thyromegaly, no masses Heart: RRR, normal S1, S2, no murmurs Lungs: CTA bilaterally, no wheezes, rhonchi, or rales Pulses: 2+ symmetric, upper extremities, normal cap refill Ext: no edema   Diabetic Foot Exam - Simple   Simple Foot Form Diabetic Foot exam was performed with the following findings:  Yes 07/09/2017 11:09 AM  Visual Inspection See comments:  Yes Sensation Testing Intact to touch and monofilament testing bilaterally:  Yes Pulse Check See comments:  Yes Comments Cold feet in  general, 1+ pedal pulses       Assessment: Encounter Diagnoses  Name Primary?  . Diabetes mellitus with complication in adult patient ([place]) Yes  . Essential hypertension   . Atherosclerosis of native coronary artery of native heart without angina pectoris   . Hyperlipidemia, unspecified hyperlipidemia type   . Former smoker   . Respiratory tract infection   . error   . Screen for colon cancer   . Decreased pulses in feet   . Vaccine counseling     Plan: Diabetes - HgbA1C at goal.  C/t metformin '500mg'$  TID, healthy diet, exercise, daily foot checks, yearly eye exam HTN - c/t same medication CAD, hyperlipidemia  - c/t same medication, health diet.  Reviewed labs from 12/2016 URI - discussed supportive care, call/recheck if worsening in next 72 hours Consider ABIs.  He will let me know Referral for cologuard I recommend you have a shingles vaccine to help prevent shingles or herpes zoster outbreak.   Please call your insurer to inquire about coverage for the Shingrix vaccine given in 2 doses.   Some insurers cover this vaccine after age 20, some cover this after age 68.  If your insurer covers this, then call to schedule appointment to have this vaccine here.  Swan was seen today for diabetes.  Diagnoses and all orders for this visit:  Diabetes mellitus with complication in adult patient ([place]) -     HM DIABETES FOOT EXAM -     HM DIABETES EYE EXAM  Essential hypertension  Atherosclerosis of native coronary artery of native heart without angina pectoris  Hyperlipidemia, unspecified hyperlipidemia type  Former smoker  Respiratory tract infection  error -     HgB A1c  Screen for colon cancer  Decreased pulses in feet  Vaccine counseling  Other orders -     metFORMIN (GLUCOPHAGE) 500 MG tablet; Take 1 tablet (500 mg total) 3 (three) times daily by mouth.

## 2017-12-18 ENCOUNTER — Other Ambulatory Visit: Payer: Self-pay | Admitting: Physician Assistant

## 2017-12-19 NOTE — Telephone Encounter (Signed)
Pt's pharmacy is requesting a refill on sildenafil. Would Dr. Burt Knack like to refill this medication? Please advise

## 2018-01-27 ENCOUNTER — Other Ambulatory Visit: Payer: Self-pay | Admitting: Cardiovascular Disease

## 2018-01-27 DIAGNOSIS — I2510 Atherosclerotic heart disease of native coronary artery without angina pectoris: Secondary | ICD-10-CM

## 2018-02-04 ENCOUNTER — Other Ambulatory Visit: Payer: Self-pay | Admitting: *Deleted

## 2018-02-04 ENCOUNTER — Telehealth: Payer: Self-pay | Admitting: Cardiovascular Disease

## 2018-02-04 DIAGNOSIS — I2510 Atherosclerotic heart disease of native coronary artery without angina pectoris: Secondary | ICD-10-CM

## 2018-02-04 MED ORDER — LISINOPRIL 10 MG PO TABS: 10.0000 mg | ORAL_TABLET | Freq: Every day | ORAL | 0 refills | Status: DC

## 2018-02-04 NOTE — Telephone Encounter (Signed)
°*  STAT* If patient is at the pharmacy, call can be transferred to refill team.   1. Which medications need to be refilled? (please list name of each medication and dose if known) pt appt had to be moved until 02-23-18-he needs his Lisinopril  2. Which Pharmacy/location (including street and city if local pharmacy) is medication to be sent to?[place] [phone]  3. Do they need a 30 day or 90 day supply? Enough until his office visit on 02-23-18

## 2018-02-11 ENCOUNTER — Ambulatory Visit: Payer: Commercial Managed Care - PPO | Admitting: Physician Assistant

## 2018-02-23 ENCOUNTER — Encounter: Payer: Self-pay | Admitting: Physician Assistant

## 2018-02-23 ENCOUNTER — Encounter: Payer: Self-pay | Admitting: *Deleted

## 2018-02-23 ENCOUNTER — Ambulatory Visit: Payer: BLUE CROSS/BLUE SHIELD | Admitting: Physician Assistant

## 2018-02-23 ENCOUNTER — Encounter (INDEPENDENT_AMBULATORY_CARE_PROVIDER_SITE_OTHER): Payer: Self-pay

## 2018-02-23 VITALS — BP 116/72 | HR 71 | Ht 74.0 in | Wt 205.6 lb

## 2018-02-23 DIAGNOSIS — I2510 Atherosclerotic heart disease of native coronary artery without angina pectoris: Secondary | ICD-10-CM | POA: Diagnosis not present

## 2018-02-23 DIAGNOSIS — I10 Essential (primary) hypertension: Secondary | ICD-10-CM

## 2018-02-23 DIAGNOSIS — R42 Dizziness and giddiness: Secondary | ICD-10-CM

## 2018-02-23 DIAGNOSIS — E785 Hyperlipidemia, unspecified: Secondary | ICD-10-CM

## 2018-02-23 LAB — COMPREHENSIVE METABOLIC PANEL
A/G RATIO: 1.9 (ref 1.2–2.2)
ALT: 20 IU/L (ref 0–44)
AST: 15 IU/L (ref 0–40)
Albumin: 4.7 g/dL (ref 3.6–4.8)
Alkaline Phosphatase: 48 IU/L (ref 39–117)
BILIRUBIN TOTAL: 0.6 mg/dL (ref 0.0–1.2)
BUN / CREAT RATIO: 12 (ref 10–24)
BUN: 12 mg/dL (ref 8–27)
CALCIUM: 9.7 mg/dL (ref 8.6–10.2)
CHLORIDE: 104 mmol/L (ref 96–106)
CO2: 19 mmol/L — ABNORMAL LOW (ref 20–29)
Creatinine, Ser: 0.99 mg/dL (ref 0.76–1.27)
GFR calc Af Amer: 93 mL/min/{1.73_m2} (ref >59)
GFR, EST NON AFRICAN AMERICAN: 80 mL/min/{1.73_m2} (ref >59)
GLOBULIN, TOTAL: 2.5 g/dL (ref 1.5–4.5)
Glucose: 155 mg/dL — ABNORMAL HIGH (ref 65–99)
POTASSIUM: 4.5 mmol/L (ref 3.5–5.2)
Sodium: 142 mmol/L (ref 134–144)
Total Protein: 7.2 g/dL (ref 6.0–8.5)

## 2018-02-23 LAB — LIPID PANEL
Chol/HDL Ratio: 5 ratio (ref 0.0–5.0)
Cholesterol, Total: 149 mg/dL (ref 100–199)
HDL: 30 mg/dL — ABNORMAL LOW (ref >39)
LDL CALC: 76 mg/dL (ref 0–99)
Triglycerides: 217 mg/dL — ABNORMAL HIGH (ref 0–149)
VLDL CHOLESTEROL CAL: 43 mg/dL — AB (ref 5–40)

## 2018-02-23 LAB — CBC
HEMOGLOBIN: 15.3 g/dL (ref 13.0–17.7)
Hematocrit: 44.8 % (ref 37.5–51.0)
MCH: 31.7 pg (ref 26.6–33.0)
MCHC: 34.2 g/dL (ref 31.5–35.7)
MCV: 93 fL (ref 79–97)
Platelets: 329 10*3/uL (ref 150–450)
RBC: 4.83 x10E6/uL (ref 4.14–5.80)
RDW: 13.3 % (ref 12.3–15.4)
WBC: 9.4 10*3/uL (ref 3.4–10.8)

## 2018-02-23 MED ORDER — SILDENAFIL CITRATE 50 MG PO TABS: ORAL_TABLET | ORAL | 0 refills | Status: DC

## 2018-02-23 NOTE — Progress Notes (Signed)
Cardiology Office Note    Date:  02/23/2018   ID:  Daniel Lucas, DOB Dec 24, 1955, MRN [id number]  PCP:  Carlena Hurl, PA-C  Cardiologist: Dr. Burt Knack  Chief Complaint: 12  Months follow up  History of Present Illness:   Daniel Lucas is a 62 y.o. male  with hx of CAD, HTN,  HLD and DM presents for yearly follow up.   Hx of  STEMI 10/2012. Cardiac catheterization demonstrated multivessel disease, but his left circumflex was the culprit vessel with acute total occlusion. He was treated with a bare-metal stent platform through the Stentys Apposition Trial. His post myocardial infarction left ventricular function was well-preserved with the exception of inferobasal hypokinesis. The LVEF was 55%.  He was doing on cardiac stand point when last seen by me 12/2016.  Here today for follow-up.  Yesterday he had an episode of sudden onset dizziness and diaphoresis at restaurant.  This occurred prior to lunch.  It was similar when he had MI in 2014.  He took aspirin and nitroglycerin without improvement.  One of his friend is paramedics who felt he did not look right.  Given drink and snack with improvement of symptoms.  He was taken to fire department with his blood pressure was 130/80 at sitting which dropped almost 20 hg his blood sugar after snack with standing.  his blood sugar snack was 174.  No recurrence.  He required nitro first time after his MI.  He is not orthostatic by vitals today.  Past Medical History:  Diagnosis Date  . Anxiety   . CAD (coronary artery disease)    a. 10/2012: inferolateral wall ST elevation MI s/p stent to distal LCx 10/29/12 (Stentys Apposition Trial randomizing him to a self-expanding bare-metal stent versus a traditional balloon expandable bare-metal coronary stent).   . Diabetes mellitus ([place])   . Former smoker   . Hyperlipidemia   . Hypertension   . Leukocytosis   . Polycythemia    a. patient reports hx of therapeutic phlebotomy in the 1970s => HCT  eventually stabilized and he no longer required phlebotomy    Past Surgical History:  Procedure Laterality Date  . LEFT HEART CATHETERIZATION WITH CORONARY ANGIOGRAM N/A 10/29/2012   Procedure: LEFT HEART CATHETERIZATION WITH CORONARY ANGIOGRAM;  Surgeon: Sherren Mocha, MD;  Location: [facility] CATH LAB;  Service: Cardiovascular;  Laterality: N/A;  . none          Current Medications: Prior to Admission medications   Medication Sig Start Date End Date Taking? Authorizing Provider  ALPRAZolam Duanne Moron) 0.5 MG tablet Take 0.5 mg by mouth 4 (four) times daily as needed. Reported on 03/01/2016    [provider]  aspirin 81 MG tablet Take 1 tablet (81 mg total) by mouth daily. 01/29/17   Tysinger, Camelia Eng, PA-C  atorvastatin (LIPITOR) 80 MG tablet Take 1 tablet (80 mg total) by mouth daily. 01/23/17   Sherren Mocha, MD  Blood Glucose Monitoring Suppl ([place]) w/Device KIT 1 Stick by Other route daily. 10/14/16   Tysinger, Camelia Eng, PA-C  carvedilol (COREG) 6.25 MG tablet Take 1 tablet (6.25 mg total) by mouth 2 (two) times daily with a meal. 01/23/17   Sherren Mocha, MD  glucose blood (ONE [facility] TEST STRIPS) test strip Use as instructed 10/14/16   Tysinger, Camelia Eng, PA-C  lisinopril (PRINIVIL) 10 MG tablet Take 1 tablet (10 mg total) by mouth daily. 02/04/18   Sherren Mocha, MD  metFORMIN (GLUCOPHAGE) 500 MG  tablet Take 1 tablet (500 mg total) 3 (three) times daily by mouth. 07/09/17   Tysinger, Camelia Eng, PA-C  nitroGLYCERIN (NITROSTAT) 0.4 MG SL tablet PLACE 1 TABLET UNDER THE TONGUE EVERY 5 MINUTES AS NEEDED FOR CHEST PAIN 01/27/18   Sherren Mocha, MD  [facility] LANCETS FINE MISC Use as instructed 10/14/16   Tysinger, Camelia Eng, PA-C  sildenafil (VIAGRA) 50 MG tablet TAKE 1 TABLET BY MOUTH ONCE DAILY AS NEEDED FOR  ERECTILE  DYSFUNCTION 12/23/17   Leanor Kail, PA    Allergies:   Patient has no known allergies.   Social History   Socioeconomic History  . Marital  status: Married    Spouse name: Not on file  . Number of children: Not on file  . Years of education: Not on file  . Highest education level: Not on file  Occupational History  . Occupation: Cabin crew  Social Needs  . Financial resource strain: Not on file  . Food insecurity:    Worry: Not on file    Inability: Not on file  . Transportation needs:    Medical: Not on file    Non-medical: Not on file  Tobacco Use  . Smoking status: Former Smoker    Packs/day: 1.00    Years: 30.00    Pack years: 30.00    Last attempt to quit: 06/16/2012    Years since quitting: 5.6  . Smokeless tobacco: Never Used  Substance and Sexual Activity  . Alcohol use: No  . Drug use: No  . Sexual activity: Not on file  Lifestyle  . Physical activity:    Days per week: Not on file    Minutes per session: Not on file  . Stress: Not on file  Relationships  . Social connections:    Talks on phone: Not on file    Gets together: Not on file    Attends religious service: Not on file    Active member of club or organization: Not on file    Attends meetings of clubs or organizations: Not on file    Relationship status: Not on file  Other Topics Concern  . Not on file  Social History Narrative   Married, lives with his wife in a 2 story home.  At least one grandfather had a history of coronary artery disease but no CAD in any first-degree relatives.   Works as a Pension scheme manager.  Has 3 children.  Education: high school.     Family History:  The patient's family history includes CAD in his brother, father, and mother; Diabetes Mellitus II in his brother and sister; Stroke in his mother. He was adopted.   ROS:   Please see the history of present illness.    ROS All other systems reviewed and are negative.   PHYSICAL EXAM:   VS:  BP 116/72   Pulse 71   Ht '6\' 2"'$  (1.88 m)   Wt 205 lb 9.6 oz (93.3 kg)   BMI 26.40 kg/m    GEN: Well nourished, well developed, in no acute distress  HEENT: normal    Neck: no JVD, carotid bruits, or masses Cardiac: RRR; no murmurs, rubs, or gallops,no edema  Respiratory:  clear to auscultation bilaterally, normal work of breathing GI: soft, nontender, nondistended, + BS MS: no deformity or atrophy  Skin: warm and dry, no rash Neuro:  Alert and Oriented x 3, Strength and sensation are intact Psych: euthymic mood, full affect  Wt Readings from Last 3 Encounters:  02/23/18 205 lb 9.6 oz (93.3 kg)  07/09/17 197 lb 9.6 oz (89.6 kg)  01/23/17 202 lb 6.4 oz (91.8 kg)      Studies/Labs Reviewed:   EKG:  EKG is ordered today.  The ekg ordered today demonstrates NSR at rate of 71 bpm   Recent Labs: No results found for requested labs within last 8760 hours.   Lipid Panel    Component Value Date/Time   CHOL 118 01/23/2017 1012   TRIG 155 (H) 01/23/2017 1012   HDL 31 (L) 01/23/2017 1012   CHOLHDL 3.8 01/23/2017 1012   CHOLHDL 3.8 08/08/2015 0001   VLDL 27 08/08/2015 0001   LDLCALC 56 01/23/2017 1012    Additional studies/ records that were reviewed today include:    Echocardiogram: 10/2012 Study Conclusions  - Left ventricle: Possible mild inferobasal hypokinesis The cavity size was normal. Systolic function was normal. The estimated ejection fraction was 55%. Wall motion was normal; there were no regional wall motion abnormalities. - Atrial septum: No defect or patent foramen ovale was identified.  Cardiac Catheterization: 10/2012 Coronary angiography: Coronary dominance: right  Left mainstem: Patent with no significant stenosis  Left anterior descending (LAD): The LAD is patent. The vessel reaches the left ventricular apex. There is mild diffuse disease and some mild intramyocardial bridging in the mid to distal vessel. The first diagonal has a high origin and has her to 40% segmental plaquing.  Left circumflex (LCx): The left circumflex is initially patent. There is a large caliber intermediate branch. The distal  circumflex is totally occluded with the angiographic appearance of an acute occlusion.  Right coronary artery (RCA): The RCA is dominant. It relatively small caliber vessel. There is 70-75% mid RCA stenosis present. The proximal and distal RCA are widely patent. The PDA and PLA branches are patent.  Left ventriculography: There is focal akinesis of the mid inferior wall. The estimated left ventricular ejection fraction is 45-50%.  PCI Data: Vessel - left circumflex/Segment - distal Percent Stenosis (pre) 100 TIMI-flow 0 Stent 2.5-3.0 x 22x2, overlapping Percent Stenosis (post) 0 TIMI-flow (post) 3  Final Conclusions:  1. Acute inferolateral myocardial infarction secondary to total occlusion left circumflex with successful primary PCI as detailed above 2. Moderate mid RCA stenosis 3. Minor nonobstructive LAD stenosis 4. Mild segmental LV dysfunction  Recommendations: Post MI medical therapy, dual antiplatelet therapy with aspirin and effient, tobacco cessation.   ASSESSMENT & PLAN:    1. CAD s/p stenting to distal LCx 3/14 ( Stentys Apposition Trial randomizing him to BMS vs DES) - No angina or dyspnea. No regular exercise lately. See below. Continue aspirin, Lipitor, Coreg and lisinopril.  2. Dizziness and diaphoresis - Yesterday episode is most consistent with hypoglycemia. However similar to prior MI. EKG without acute changes. He hasn't done any exertional exercise on a regular basis lately.  Not orthostatic by vital today.  He thinks this might be due to his heart.  Will get an exercise Myoview to rule out any underlying ischemia.  2. HTN - Stable and well controlled on current regimen.  3. HLD - Continue statin. Will check labs.  4. DM - Per PCP   Medication Adjustments/Labs and Tests Ordered: Current medicines are reviewed at length with the patient today.  Concerns regarding medicines are outlined above.  Medication changes, Labs and Tests ordered  today are listed in the Patient Instructions below. Patient Instructions  Medication Instructions:  Your physician recommends that you continue on your current medications as directed. Please  refer to the Current Medication list given to you today.   Labwork: None ordered  Testing/Procedures: None ordered  Follow-Up: Your physician recommends that you schedule a follow-up appointment in:    Any Other Special Instructions Will Be Listed Below (If Applicable).     If you need a refill on your cardiac medications before your next appointment, please call your pharmacy.      Jarrett Soho, [place]  02/23/2018 9:34 AM    [facility] [address], [place], [place]  [address] Phone: [phone]; Fax: [phone]

## 2018-02-23 NOTE — Patient Instructions (Addendum)
Medication Instructions:  Your physician recommends that you continue on your current medications as directed. Please refer to the Current Medication list given to you today.   Labwork: TODAY:  CMET, CBC, & LIPID  Testing/Procedures: Your physician has requested that you have en exercise stress myoview. For further information please visit [email or web address]. Please follow instruction sheet, as given.    Follow-Up: Your physician wants you to follow-up in: [place] will receive a reminder letter in the mail two months in advance. If you don't receive a letter, please call our office to schedule the follow-up appointment.    Any Other Special Instructions Will Be Listed Below (If Applicable).  Exercise Stress Electrocardiogram An exercise stress electrocardiogram is a test that is done to evaluate the blood supply to your heart. This test may also be called exercise stress electrocardiography. The test is done while you are walking on a treadmill. The goal of this test is to raise your heart rate. This test is done to find areas of poor blood flow to the heart by determining the extent of coronary artery disease (CAD). CAD is defined as narrowing in one or more heart (coronary) arteries of more than 70%. If you have an abnormal test result, this may mean that you are not getting adequate blood flow to your heart during exercise. Additional testing may be needed to understand why your test was abnormal. Tell a health care provider about:  Any allergies you have.  All medicines you are taking, including vitamins, herbs, eye drops, creams, and over-the-counter medicines.  Any problems you or family members have had with anesthetic medicines.  Any blood disorders you have.  Any surgeries you have had.  Any medical conditions you have.  Possibility of pregnancy, if this applies. What are the risks? Generally, this is a safe procedure. However, as with any procedure,  complications can occur. Possible complications can include:  Pain or pressure in the following areas: ? Chest. ? Jaw or neck. ? Between your shoulder blades. ? Radiating down your left arm.  Dizziness or light-headedness.  Shortness of breath.  Increased or irregular heartbeats.  Nausea or vomiting.  Heart attack (rare).  What happens before the procedure?  Avoid all forms of caffeine 24 hours before your test or as directed by your health care provider. This includes coffee, tea (even decaffeinated tea), caffeinated sodas, chocolate, cocoa, and certain pain medicines.  Follow your health care provider's instructions regarding eating and drinking before the test.  Take your medicines as directed at regular times with water unless instructed otherwise. Exceptions may include: ? If you have diabetes, ask how you are to take your insulin or pills. It is common to adjust insulin dosing the morning of the test. ? If you are taking beta-blocker medicines, it is important to talk to your health care provider about these medicines well before the date of your test. Taking beta-blocker medicines may interfere with the test. In some cases, these medicines need to be changed or stopped 24 hours or more before the test. ? If you wear a nitroglycerin patch, it may need to be removed prior to the test. Ask your health care provider if the patch should be removed before the test.  If you use an inhaler for any breathing condition, bring it with you to the test.  If you are an outpatient, bring a snack so you can eat right after the stress phase of the test.  Do not smoke  for 4 hours prior to the test or as directed by your health care provider.  Do not apply lotions, powders, creams, or oils on your chest prior to the test.  Wear loose-fitting clothes and comfortable shoes for the test. This test involves walking on a treadmill. What happens during the procedure?  Multiple patches  (electrodes) will be put on your chest. If needed, small areas of your chest may have to be shaved to get better contact with the electrodes. Once the electrodes are attached to your body, multiple wires will be attached to the electrodes and your heart rate will be monitored.  Your heart will be monitored both at rest and while exercising.  You will walk on a treadmill. The treadmill will be started at a slow pace. The treadmill speed and incline will gradually be increased to raise your heart rate. What happens after the procedure?  Your heart rate and blood pressure will be monitored after the test.  You may return to your normal schedule including diet, activities, and medicines, unless your health care provider tells you otherwise. This information is not intended to replace advice given to you by your health care provider. Make sure you discuss any questions you have with your health care provider. Document Released: 08/09/2000 Document Revised: 01/18/2016 Document Reviewed: 04/19/2013 Elsevier Interactive Patient Education  2017 [employer].     If you need a refill on your cardiac medications before your next appointment, please call your pharmacy.

## 2018-02-24 MED ORDER — CARVEDILOL 6.25 MG PO TABS: 6.2500 mg | ORAL_TABLET | Freq: Two times a day (BID) | ORAL | 3 refills | Status: DC

## 2018-02-24 MED ORDER — ATORVASTATIN CALCIUM 80 MG PO TABS: 80.0000 mg | ORAL_TABLET | Freq: Every day | ORAL | 3 refills | Status: DC

## 2018-02-24 MED ORDER — LISINOPRIL 10 MG PO TABS: 10.0000 mg | ORAL_TABLET | Freq: Every day | ORAL | 0 refills | Status: DC

## 2018-02-24 NOTE — Addendum Note (Signed)
Addended by: Gaetano Net on: 02/24/2018 11:14 AM   Modules accepted: Orders

## 2018-03-04 ENCOUNTER — Encounter (HOSPITAL_COMMUNITY): Payer: BLUE CROSS/BLUE SHIELD

## 2018-03-04 ENCOUNTER — Telehealth: Payer: Self-pay | Admitting: Cardiovascular Disease

## 2018-03-04 DIAGNOSIS — I2510 Atherosclerotic heart disease of native coronary artery without angina pectoris: Secondary | ICD-10-CM

## 2018-03-04 MED ORDER — LISINOPRIL 10 MG PO TABS: 10.0000 mg | ORAL_TABLET | Freq: Every day | ORAL | 3 refills | Status: DC

## 2018-03-04 NOTE — Telephone Encounter (Signed)
New message  Pt states that he has a 90 day refill now but he states that there is no further refills after this one runs out.  *STAT* If patient is at the pharmacy, call can be transferred to refill team.   1. Which medications need to be refilled? (please list name of each medication and dose if known) lisinopril (PRINIVIL) 10 MG tablet  2. Which pharmacy/location (including street and city if [employer]) is medication to be sent [facility] in [place], [place]  3. Do they need a 30 day or 90 day supply? 90 supply refill

## 2018-03-04 NOTE — Telephone Encounter (Signed)
Pt's medication was sent to pt's pharmacy as requested. Confirmation received.  °

## 2018-03-13 ENCOUNTER — Telehealth: Payer: Self-pay | Admitting: Cardiovascular Disease

## 2018-03-13 NOTE — Telephone Encounter (Signed)
New Message:      Pt is returning a call but not sure what it's for.

## 2018-03-16 ENCOUNTER — Telehealth (HOSPITAL_COMMUNITY): Payer: Self-pay | Admitting: Physician Assistant

## 2018-03-16 NOTE — Telephone Encounter (Signed)
02/24/18 A.Williams----Cancel Rsn: Patient (patient will be working out of town will call back to reschedule )  03/13/18 Called pt and lmsg for him to CB to get r/s for myoview..RG 03/16/18 Patient told scheduler that he will CB next year to have test done if he needs it.Marland KitchenRG

## 2018-04-30 DIAGNOSIS — F410 Panic disorder [episodic paroxysmal anxiety] without agoraphobia: Secondary | ICD-10-CM | POA: Diagnosis not present

## 2018-06-02 DIAGNOSIS — Z23 Encounter for immunization: Secondary | ICD-10-CM | POA: Diagnosis not present

## 2018-07-22 ENCOUNTER — Ambulatory Visit: Payer: BLUE CROSS/BLUE SHIELD | Admitting: Medical

## 2018-07-22 VITALS — BP 130/80 | HR 77 | Wt 207.8 lb

## 2018-07-22 DIAGNOSIS — D751 Secondary polycythemia: Secondary | ICD-10-CM

## 2018-07-22 DIAGNOSIS — R0989 Other specified symptoms and signs involving the circulatory and respiratory systems: Secondary | ICD-10-CM

## 2018-07-22 DIAGNOSIS — Z7189 Other specified counseling: Secondary | ICD-10-CM

## 2018-07-22 DIAGNOSIS — E118 Type 2 diabetes mellitus with unspecified complications: Secondary | ICD-10-CM

## 2018-07-22 DIAGNOSIS — E785 Hyperlipidemia, unspecified: Secondary | ICD-10-CM | POA: Diagnosis not present

## 2018-07-22 DIAGNOSIS — Z1211 Encounter for screening for malignant neoplasm of colon: Secondary | ICD-10-CM

## 2018-07-22 DIAGNOSIS — I2510 Atherosclerotic heart disease of native coronary artery without angina pectoris: Secondary | ICD-10-CM | POA: Diagnosis not present

## 2018-07-22 DIAGNOSIS — I10 Essential (primary) hypertension: Secondary | ICD-10-CM

## 2018-07-22 DIAGNOSIS — Z136 Encounter for screening for cardiovascular disorders: Secondary | ICD-10-CM

## 2018-07-22 DIAGNOSIS — Z7185 Encounter for immunization safety counseling: Secondary | ICD-10-CM

## 2018-07-22 LAB — POCT GLYCOSYLATED HEMOGLOBIN (HGB A1C): HEMOGLOBIN A1C: 3.6 % — AB (ref 4.0–5.6)

## 2018-07-22 MED ORDER — METFORMIN HCL 500 MG PO TABS: 500.0000 mg | ORAL_TABLET | Freq: Three times a day (TID) | ORAL | 1 refills | Status: DC

## 2018-07-22 MED ORDER — ASPIRIN 81 MG PO TABS: 81.0000 mg | ORAL_TABLET | Freq: Every day | ORAL | 3 refills | Status: DC
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
## 2018-07-22 NOTE — Addendum Note (Signed)
Addended by: Gwinda [place] on: 07/22/2018 10:04 AM   Modules accepted: Orders

## 2018-07-22 NOTE — Patient Instructions (Addendum)
Recommendations as below  We will refer you again for Cologuard colon cancer screening.  You should get a kit in the mail  Next year after your back in town, I would like to refer you for ABI blood flow screen in your legs.  This is a diabetic vascular disease screening test  Shingles vaccine:  I recommend you have a shingles vaccine to help prevent shingles or herpes zoster outbreak.   Please call your insurer to inquire about coverage for the Shingrix vaccine given in 2 doses.   Some insurers cover this vaccine after age 48, some cover this after age 57.  If your insurer covers this, then call to schedule appointment to have this vaccine here.

## 2018-09-16 DIAGNOSIS — M9902 Segmental and somatic dysfunction of thoracic region: Secondary | ICD-10-CM | POA: Diagnosis not present

## 2018-09-16 DIAGNOSIS — M545 Low back pain: Secondary | ICD-10-CM | POA: Diagnosis not present

## 2018-09-16 DIAGNOSIS — M9903 Segmental and somatic dysfunction of lumbar region: Secondary | ICD-10-CM | POA: Diagnosis not present

## 2018-09-16 DIAGNOSIS — M62830 Muscle spasm of back: Secondary | ICD-10-CM | POA: Diagnosis not present

## 2018-09-17 DIAGNOSIS — M9902 Segmental and somatic dysfunction of thoracic region: Secondary | ICD-10-CM | POA: Diagnosis not present

## 2018-09-17 DIAGNOSIS — M9903 Segmental and somatic dysfunction of lumbar region: Secondary | ICD-10-CM | POA: Diagnosis not present

## 2018-09-17 DIAGNOSIS — M545 Low back pain: Secondary | ICD-10-CM | POA: Diagnosis not present

## 2018-09-17 DIAGNOSIS — M62830 Muscle spasm of back: Secondary | ICD-10-CM | POA: Diagnosis not present

## 2018-10-12 DIAGNOSIS — M545 Low back pain: Secondary | ICD-10-CM | POA: Diagnosis not present

## 2018-10-12 DIAGNOSIS — M62830 Muscle spasm of back: Secondary | ICD-10-CM | POA: Diagnosis not present

## 2018-10-12 DIAGNOSIS — M9902 Segmental and somatic dysfunction of thoracic region: Secondary | ICD-10-CM | POA: Diagnosis not present

## 2018-10-12 DIAGNOSIS — M9903 Segmental and somatic dysfunction of lumbar region: Secondary | ICD-10-CM | POA: Diagnosis not present

## 2018-10-13 DIAGNOSIS — M9903 Segmental and somatic dysfunction of lumbar region: Secondary | ICD-10-CM | POA: Diagnosis not present

## 2018-10-13 DIAGNOSIS — M9902 Segmental and somatic dysfunction of thoracic region: Secondary | ICD-10-CM | POA: Diagnosis not present

## 2018-10-13 DIAGNOSIS — M545 Low back pain: Secondary | ICD-10-CM | POA: Diagnosis not present

## 2018-10-13 DIAGNOSIS — M62830 Muscle spasm of back: Secondary | ICD-10-CM | POA: Diagnosis not present

## 2018-10-14 DIAGNOSIS — M545 Low back pain: Secondary | ICD-10-CM | POA: Diagnosis not present

## 2018-10-14 DIAGNOSIS — M9903 Segmental and somatic dysfunction of lumbar region: Secondary | ICD-10-CM | POA: Diagnosis not present

## 2018-10-14 DIAGNOSIS — M62830 Muscle spasm of back: Secondary | ICD-10-CM | POA: Diagnosis not present

## 2018-10-14 DIAGNOSIS — M9902 Segmental and somatic dysfunction of thoracic region: Secondary | ICD-10-CM | POA: Diagnosis not present

## 2018-10-15 DIAGNOSIS — M62830 Muscle spasm of back: Secondary | ICD-10-CM | POA: Diagnosis not present

## 2018-10-15 DIAGNOSIS — M545 Low back pain: Secondary | ICD-10-CM | POA: Diagnosis not present

## 2018-10-15 DIAGNOSIS — M9902 Segmental and somatic dysfunction of thoracic region: Secondary | ICD-10-CM | POA: Diagnosis not present

## 2018-10-15 DIAGNOSIS — M9903 Segmental and somatic dysfunction of lumbar region: Secondary | ICD-10-CM | POA: Diagnosis not present

## 2018-10-19 DIAGNOSIS — M9902 Segmental and somatic dysfunction of thoracic region: Secondary | ICD-10-CM | POA: Diagnosis not present

## 2018-10-19 DIAGNOSIS — M62830 Muscle spasm of back: Secondary | ICD-10-CM | POA: Diagnosis not present

## 2018-10-19 DIAGNOSIS — M545 Low back pain: Secondary | ICD-10-CM | POA: Diagnosis not present

## 2018-10-19 DIAGNOSIS — M9903 Segmental and somatic dysfunction of lumbar region: Secondary | ICD-10-CM | POA: Diagnosis not present

## 2018-10-21 DIAGNOSIS — M5442 Lumbago with sciatica, left side: Secondary | ICD-10-CM | POA: Diagnosis not present

## 2018-10-21 DIAGNOSIS — M5136 Other intervertebral disc degeneration, lumbar region: Secondary | ICD-10-CM | POA: Diagnosis not present

## 2018-10-21 DIAGNOSIS — M9903 Segmental and somatic dysfunction of lumbar region: Secondary | ICD-10-CM | POA: Diagnosis not present

## 2018-10-21 DIAGNOSIS — M546 Pain in thoracic spine: Secondary | ICD-10-CM | POA: Diagnosis not present

## 2018-10-22 DIAGNOSIS — M9903 Segmental and somatic dysfunction of lumbar region: Secondary | ICD-10-CM | POA: Diagnosis not present

## 2018-10-22 DIAGNOSIS — M5136 Other intervertebral disc degeneration, lumbar region: Secondary | ICD-10-CM | POA: Diagnosis not present

## 2018-10-22 DIAGNOSIS — M5442 Lumbago with sciatica, left side: Secondary | ICD-10-CM | POA: Diagnosis not present

## 2018-10-22 DIAGNOSIS — M546 Pain in thoracic spine: Secondary | ICD-10-CM | POA: Diagnosis not present

## 2018-10-26 DIAGNOSIS — M5136 Other intervertebral disc degeneration, lumbar region: Secondary | ICD-10-CM | POA: Diagnosis not present

## 2018-10-26 DIAGNOSIS — M546 Pain in thoracic spine: Secondary | ICD-10-CM | POA: Diagnosis not present

## 2018-10-26 DIAGNOSIS — M5442 Lumbago with sciatica, left side: Secondary | ICD-10-CM | POA: Diagnosis not present

## 2018-10-26 DIAGNOSIS — M9903 Segmental and somatic dysfunction of lumbar region: Secondary | ICD-10-CM | POA: Diagnosis not present

## 2018-10-28 DIAGNOSIS — M9903 Segmental and somatic dysfunction of lumbar region: Secondary | ICD-10-CM | POA: Diagnosis not present

## 2018-10-28 DIAGNOSIS — M5136 Other intervertebral disc degeneration, lumbar region: Secondary | ICD-10-CM | POA: Diagnosis not present

## 2018-10-28 DIAGNOSIS — M5442 Lumbago with sciatica, left side: Secondary | ICD-10-CM | POA: Diagnosis not present

## 2018-10-28 DIAGNOSIS — M546 Pain in thoracic spine: Secondary | ICD-10-CM | POA: Diagnosis not present

## 2018-10-30 DIAGNOSIS — M9903 Segmental and somatic dysfunction of lumbar region: Secondary | ICD-10-CM | POA: Diagnosis not present

## 2018-10-30 DIAGNOSIS — M5442 Lumbago with sciatica, left side: Secondary | ICD-10-CM | POA: Diagnosis not present

## 2018-10-30 DIAGNOSIS — M5136 Other intervertebral disc degeneration, lumbar region: Secondary | ICD-10-CM | POA: Diagnosis not present

## 2018-11-02 DIAGNOSIS — M5136 Other intervertebral disc degeneration, lumbar region: Secondary | ICD-10-CM | POA: Diagnosis not present

## 2018-11-02 DIAGNOSIS — M5442 Lumbago with sciatica, left side: Secondary | ICD-10-CM | POA: Diagnosis not present

## 2018-11-02 DIAGNOSIS — M546 Pain in thoracic spine: Secondary | ICD-10-CM | POA: Diagnosis not present

## 2018-11-02 DIAGNOSIS — M9903 Segmental and somatic dysfunction of lumbar region: Secondary | ICD-10-CM | POA: Diagnosis not present

## 2018-11-04 DIAGNOSIS — M5442 Lumbago with sciatica, left side: Secondary | ICD-10-CM | POA: Diagnosis not present

## 2018-11-04 DIAGNOSIS — M5136 Other intervertebral disc degeneration, lumbar region: Secondary | ICD-10-CM | POA: Diagnosis not present

## 2018-11-04 DIAGNOSIS — M9903 Segmental and somatic dysfunction of lumbar region: Secondary | ICD-10-CM | POA: Diagnosis not present

## 2018-11-06 DIAGNOSIS — M5136 Other intervertebral disc degeneration, lumbar region: Secondary | ICD-10-CM | POA: Diagnosis not present

## 2018-11-06 DIAGNOSIS — M5442 Lumbago with sciatica, left side: Secondary | ICD-10-CM | POA: Diagnosis not present

## 2018-11-06 DIAGNOSIS — M9903 Segmental and somatic dysfunction of lumbar region: Secondary | ICD-10-CM | POA: Diagnosis not present

## 2018-11-09 DIAGNOSIS — M9903 Segmental and somatic dysfunction of lumbar region: Secondary | ICD-10-CM | POA: Diagnosis not present

## 2018-11-09 DIAGNOSIS — M5442 Lumbago with sciatica, left side: Secondary | ICD-10-CM | POA: Diagnosis not present

## 2018-11-09 DIAGNOSIS — M5136 Other intervertebral disc degeneration, lumbar region: Secondary | ICD-10-CM | POA: Diagnosis not present

## 2018-11-11 DIAGNOSIS — M5136 Other intervertebral disc degeneration, lumbar region: Secondary | ICD-10-CM | POA: Diagnosis not present

## 2018-11-11 DIAGNOSIS — M5442 Lumbago with sciatica, left side: Secondary | ICD-10-CM | POA: Diagnosis not present

## 2018-11-11 DIAGNOSIS — M9903 Segmental and somatic dysfunction of lumbar region: Secondary | ICD-10-CM | POA: Diagnosis not present

## 2018-11-13 DIAGNOSIS — M5136 Other intervertebral disc degeneration, lumbar region: Secondary | ICD-10-CM | POA: Diagnosis not present

## 2018-11-13 DIAGNOSIS — M9903 Segmental and somatic dysfunction of lumbar region: Secondary | ICD-10-CM | POA: Diagnosis not present

## 2018-11-13 DIAGNOSIS — M5442 Lumbago with sciatica, left side: Secondary | ICD-10-CM | POA: Diagnosis not present

## 2018-11-26 DIAGNOSIS — F410 Panic disorder [episodic paroxysmal anxiety] without agoraphobia: Secondary | ICD-10-CM | POA: Diagnosis not present

## 2019-02-18 ENCOUNTER — Other Ambulatory Visit: Payer: Self-pay

## 2019-02-18 NOTE — Telephone Encounter (Signed)
Pt called and stated he is still working out of town and he will need Metformin before his job is complete out of town. Please advise. Pt stated he will need a couple of months worth because he just don't know when he'll be back. Maybe a 90 day supply.

## 2019-02-19 ENCOUNTER — Telehealth: Payer: Self-pay | Admitting: Physician Assistant

## 2019-02-19 MED ORDER — METFORMIN HCL 500 MG PO TABS: 500.0000 mg | ORAL_TABLET | Freq: Three times a day (TID) | ORAL | 1 refills | Status: DC

## 2019-02-19 NOTE — Telephone Encounter (Signed)
I will send this Robbie Lis, PA, RMA [place]. Pt is already scheduled to see Richardson Dopp, PA

## 2019-02-19 NOTE — Telephone Encounter (Signed)
F/U Message           Patient called back states that he is ok with a telephone visit if that's ok with Bhagat. Pls call back to advise.

## 2019-02-22 ENCOUNTER — Telehealth: Payer: Self-pay | Admitting: Physician Assistant

## 2019-02-22 NOTE — Telephone Encounter (Signed)

## 2019-02-22 NOTE — Progress Notes (Signed)
Virtual Visit via Telephone Note   This visit type was conducted due to national recommendations for restrictions regarding the COVID-19 Pandemic (e.g. social distancing) in an effort to limit this patient's exposure and mitigate transmission in our community.  Due to his co-morbid illnesses, this patient is at least at moderate risk for complications without adequate follow up.  This format is felt to be most appropriate for this patient at this time.  The patient did not have access to video technology/had technical difficulties with video requiring transitioning to audio format only (telephone).  All issues noted in this document were discussed and addressed.  No physical exam could be performed with this format.  Please refer to the patient's chart for his  consent to telehealth for [facility].   Date:  02/23/2019   ID:  Daniel Lucas, DOB [birth year], MRN [id number]  Patient Location: Other:  Working in [place], [place] Provider Location: Office  PCP:  Carlena Hurl, PA-C  Cardiologist:  Sherren Mocha, MD   Electrophysiologist:  None   Evaluation Performed:  Follow-Up Visit  Chief Complaint:  Follow up on CAD  History of Present Illness:    Daniel Lucas is a 63 y.o. male with:  Coronary artery disease   S/p inf STEMI 10/2012 tx with BMS x 2 to LCx (Stentys Apposition Trial)  Diabetes Mellitus  Hypertension  Hyperlipidemia   Former Tobacco use  He was last seen in July 2019 by Leanor Kail, PA-C.  Today, he notes he is doing well.  He is currently working in [place] [employer].  He still has a couple of months before he will complete his job and come home.  He has not had chest pain, shortness of breath, syncope, leg swelling or orthopnea.  The patient does not have symptoms concerning for COVID-19 infection (fever, chills, cough, or new shortness of breath).    Past Medical History:  Diagnosis Date  . Anxiety   . CAD (coronary artery  disease)    a. 10/2012: inferolateral wall ST elevation MI s/p stent to distal LCx 10/29/12 (Stentys Apposition Trial randomizing him to a self-expanding bare-metal stent versus a traditional balloon expandable bare-metal coronary stent).   . Diabetes mellitus ([place])   . Former smoker   . Hyperlipidemia   . Hypertension   . Leukocytosis   . Polycythemia    a. patient reports hx of therapeutic phlebotomy in the 1970s => HCT eventually stabilized and he no longer required phlebotomy   Past Surgical History:  Procedure Laterality Date  . LEFT HEART CATHETERIZATION WITH CORONARY ANGIOGRAM N/A 10/29/2012   Procedure: LEFT HEART CATHETERIZATION WITH CORONARY ANGIOGRAM;  Surgeon: Sherren Mocha, MD;  Location: [facility] CATH LAB;  Service: Cardiovascular;  Laterality: N/A;  . none           Current Meds  Medication Sig  . ALPRAZolam (XANAX) 0.5 MG tablet Take 0.5 mg by mouth 4 (four) times daily as needed. Reported on 03/01/2016  . aspirin 81 MG tablet Take 1 tablet (81 mg total) by mouth daily.  Marland Kitchen atorvastatin (LIPITOR) 80 MG tablet Take 1 tablet (80 mg total) by mouth daily.  . carvedilol (COREG) 6.25 MG tablet Take 1 tablet (6.25 mg total) by mouth 2 (two) times daily with a meal.  . lisinopril (PRINIVIL) 10 MG tablet Take 1 tablet (10 mg total) by mouth daily.  . metFORMIN (GLUCOPHAGE) 500 MG tablet Take 1 tablet (500 mg total) by mouth 3 (three) times daily.  . [  DISCONTINUED] atorvastatin (LIPITOR) 80 MG tablet Take 1 tablet (80 mg total) by mouth daily.  . [DISCONTINUED] carvedilol (COREG) 6.25 MG tablet Take 1 tablet (6.25 mg total) by mouth 2 (two) times daily with a meal.  . [DISCONTINUED] lisinopril (PRINIVIL) 10 MG tablet Take 1 tablet (10 mg total) by mouth daily.     Allergies:   Patient has no known allergies.   Social History   Tobacco Use  . Smoking status: Former Smoker    Packs/day: 1.00    Years: 30.00    Pack years: 30.00    Quit date: 06/16/2012    Years since quitting: 6.6   . Smokeless tobacco: Never Used  Substance Use Topics  . Alcohol use: No  . Drug use: No     Family Hx: The patient's family history includes CAD in his brother, father, and mother; Diabetes Mellitus II in his brother and sister; Stroke in his mother. He was adopted.  ROS:   Please see the history of present illness.    All other systems reviewed and are negative.   Prior CV studies:   The following studies were reviewed today:  Echo 10/30/12 Poss inf-basal HK, EF 55  Cardiac Catheterization 10/29/12 LAD mid to dist intramyocardial bridging, D1 40 LCx dist 100 RCA (dominant, small) mid 70-75 EF 45-50 PCI:  BMS x 2 to LCx  Labs/Other Tests and Data Reviewed:    EKG:  No ECG reviewed.  Recent Labs: No results found for requested labs within last 8760 hours.   Recent Lipid Panel Lab Results  Component Value Date/Time   CHOL 149 02/23/2018 10:06 AM   TRIG 217 (H) 02/23/2018 10:06 AM   HDL 30 (L) 02/23/2018 10:06 AM   CHOLHDL 5.0 02/23/2018 10:06 AM   CHOLHDL 3.8 08/08/2015 12:01 AM   LDLCALC 76 02/23/2018 10:06 AM     Wt Readings from Last 3 Encounters:  02/23/19 204 lb (92.5 kg)  07/22/18 207 lb 12.8 oz (94.3 kg)  02/23/18 205 lb 9.6 oz (93.3 kg)     Objective:    Vital Signs:  Ht 6\' 2"  (1.88 m)   Wt 204 lb (92.5 kg)   BMI 26.19 kg/m    VITAL SIGNS:  reviewed GEN:  no acute distress RESPIRATORY:  no labored breathing NEURO:  alert and oriented PSYCH:  normal mood  ASSESSMENT & PLAN:    1. Coronary artery disease involving native coronary artery of native heart without angina pectoris History of inferior MI in 2014 treated with BMS to the LCx x2.  He is doing well without anginal symptoms.  Continue aspirin, statin, beta-blocker, ACE inhibitor.  2. Diabetes mellitus with complication in adult patient ([place]) Last A1c 3.6.  Continue follow-up with primary care.  3. Essential hypertension He does not have a blood pressure cuff.  However, he has checked  it up on occasion and it is 120s over 80s.  Continue current therapy.  4. Hyperlipidemia, unspecified hyperlipidemia type He has had some difficulty with diarrhea that he attributes to atorvastatin.  He has been taking half a tablet daily with improved symptoms.  I have suggested that he continue 40 mg daily.  We can obtain follow-up CMET, lipids at his next visit in 6 months.  If he continues to have symptoms, we can try changing him to Crestor.  5. Erectile dysfunction, unspecified erectile dysfunction type He needs a refill of his sildenafil.  50 mg tablets, #12, no refills will be sent to his pharmacy.  6. Educated About Covid-19 Virus Infection The signs and symptoms of COVID-19 were discussed with the patient and how to seek care for testing (follow up with PCP or arrange E-visit).  The importance of social distancing was discussed today.  Time:   Today, I have spent 28 minutes with the patient with telehealth technology discussing the above problems.     Medication Adjustments/Labs and Tests Ordered: Current medicines are reviewed at length with the patient today.  Concerns regarding medicines are outlined above.   Tests Ordered: Orders Placed This Encounter  Procedures  . Comprehensive metabolic panel  . Lipid panel    Medication Changes: Meds ordered this encounter  Medications  . lisinopril (PRINIVIL) 10 MG tablet    Sig: Take 1 tablet (10 mg total) by mouth daily.    Dispense:  90 tablet    Refill:  3  . carvedilol (COREG) 6.25 MG tablet    Sig: Take 1 tablet (6.25 mg total) by mouth 2 (two) times daily with a meal.    Dispense:  180 tablet    Refill:  3  . atorvastatin (LIPITOR) 80 MG tablet    Sig: Take 1 tablet (80 mg total) by mouth daily.    Dispense:  90 tablet    Refill:  3  . sildenafil (VIAGRA) 50 MG tablet    Sig: Take 1 tablet (50 mg total) by mouth daily as needed for erectile dysfunction.    Dispense:  12 tablet    Refill:  0    Follow Up:  In  Person in 1 year(s) with me or Dr. Burt Knack  Signed, Richardson Dopp, PA-C  02/23/2019 5:43 PM    [place]

## 2019-02-23 ENCOUNTER — Other Ambulatory Visit: Payer: Self-pay

## 2019-02-23 ENCOUNTER — Telehealth: Payer: Self-pay | Admitting: Physician Assistant

## 2019-02-23 ENCOUNTER — Telehealth (INDEPENDENT_AMBULATORY_CARE_PROVIDER_SITE_OTHER): Payer: BC Managed Care – PPO | Admitting: Physician Assistant

## 2019-02-23 ENCOUNTER — Encounter: Payer: Self-pay | Admitting: Physician Assistant

## 2019-02-23 VITALS — Ht 74.0 in | Wt 204.0 lb

## 2019-02-23 DIAGNOSIS — I2510 Atherosclerotic heart disease of native coronary artery without angina pectoris: Secondary | ICD-10-CM | POA: Diagnosis not present

## 2019-02-23 DIAGNOSIS — E785 Hyperlipidemia, unspecified: Secondary | ICD-10-CM

## 2019-02-23 DIAGNOSIS — N529 Male erectile dysfunction, unspecified: Secondary | ICD-10-CM

## 2019-02-23 DIAGNOSIS — I10 Essential (primary) hypertension: Secondary | ICD-10-CM

## 2019-02-23 DIAGNOSIS — E118 Type 2 diabetes mellitus with unspecified complications: Secondary | ICD-10-CM

## 2019-02-23 DIAGNOSIS — Z7189 Other specified counseling: Secondary | ICD-10-CM

## 2019-02-23 MED ORDER — CARVEDILOL 6.25 MG PO TABS: 6.2500 mg | ORAL_TABLET | Freq: Two times a day (BID) | ORAL | 3 refills | Status: DC

## 2019-02-23 MED ORDER — SILDENAFIL CITRATE 50 MG PO TABS: 50.0000 mg | ORAL_TABLET | Freq: Every day | ORAL | 0 refills | Status: DC | PRN

## 2019-02-23 MED ORDER — ATORVASTATIN CALCIUM 80 MG PO TABS: 80.0000 mg | ORAL_TABLET | Freq: Every day | ORAL | 3 refills | Status: DC

## 2019-02-23 MED ORDER — LISINOPRIL 10 MG PO TABS: 10.0000 mg | ORAL_TABLET | Freq: Every day | ORAL | 3 refills | Status: DC

## 2019-02-23 NOTE — Telephone Encounter (Signed)
Should be refilled with PCP. Richardson Dopp, PA-C    02/23/2019 2:32 PM

## 2019-02-23 NOTE — Patient Instructions (Addendum)
Medication Instructions:  Your physician recommends that you continue on your current medications as directed. Please refer to the Current Medication list given to you today.  If you need a refill on your cardiac medications before your next appointment, please call your pharmacy.   Lab work: in 6 months CMET LIPID   If you have labs (blood work) drawn today and your tests are completely normal, you will receive your results only by: Marland Kitchen MyChart Message (if you have MyChart) OR . A paper copy in the mail If you have any lab test that is abnormal or we need to change your treatment, we will call you to review the results.  Testing/Procedures: NONE ORDERED  TODAY    Follow-Up: At [facility], you and your health needs are our priority.  As part of our continuing mission to provide you with exceptional heart care, we have created designated Provider Care Teams.  These Care Teams include your primary Cardiologist (physician) and Advanced Practice Providers (APPs -  Physician Assistants and Nurse Practitioners) who all work together to provide you with the care you need, when you need it. You will need a follow up appointment in:  6 months.  Please call our office 2 months in advance to schedule this appointment.  You may see Sherren Mocha, MD or one of the following Advanced Practice Providers on your designated Care Team: Richardson Dopp, PA-C [place], [place] . Daune Perch, NP  Any Other Special Instructions Will Be Listed Below (If Applicable).

## 2019-02-23 NOTE — Telephone Encounter (Signed)
[place]            Patient  called back to let us know that he does not need "Alprazolam"called in he gets that at another doctor. Just and FYI

## 2019-06-16 ENCOUNTER — Telehealth: Payer: Self-pay | Admitting: Cardiovascular Disease

## 2019-06-16 NOTE — Telephone Encounter (Signed)
Patient received a phone call from our office earlier today in regards to an upcoming  appointment. He is not scheduled for anything. He had a telemedicine visit back in June with Richardson Dopp, but is not due to see [place] for a year. The only thing that he can think of was maybe needing some labs, but he has his labs done at his PCP office too.

## 2019-06-16 NOTE — Telephone Encounter (Signed)
Reviewed Scott's note with patient. The patient is due for fasting blood work (which he states he gets at his PCP) in December and [place] in June 2021. New recall placed. He was grateful for clarification and agrees with treatment plan.

## 2019-07-29 DIAGNOSIS — F410 Panic disorder [episodic paroxysmal anxiety] without agoraphobia: Secondary | ICD-10-CM | POA: Diagnosis not present

## 2019-07-30 ENCOUNTER — Other Ambulatory Visit: Payer: Self-pay

## 2019-07-30 ENCOUNTER — Encounter: Payer: Self-pay | Admitting: Medical

## 2019-07-30 ENCOUNTER — Ambulatory Visit: Payer: BC Managed Care – PPO | Admitting: Medical

## 2019-07-30 ENCOUNTER — Telehealth: Payer: Self-pay | Admitting: Medical

## 2019-07-30 VITALS — BP 140/86 | HR 79 | Temp 98.1°F | Ht 74.0 in | Wt 212.8 lb

## 2019-07-30 DIAGNOSIS — E785 Hyperlipidemia, unspecified: Secondary | ICD-10-CM

## 2019-07-30 DIAGNOSIS — I2510 Atherosclerotic heart disease of native coronary artery without angina pectoris: Secondary | ICD-10-CM

## 2019-07-30 DIAGNOSIS — Z0000 Encounter for general adult medical examination without abnormal findings: Secondary | ICD-10-CM | POA: Diagnosis not present

## 2019-07-30 DIAGNOSIS — Z7185 Encounter for immunization safety counseling: Secondary | ICD-10-CM

## 2019-07-30 DIAGNOSIS — Z125 Encounter for screening for malignant neoplasm of prostate: Secondary | ICD-10-CM

## 2019-07-30 DIAGNOSIS — Z87891 Personal history of nicotine dependence: Secondary | ICD-10-CM

## 2019-07-30 DIAGNOSIS — I10 Essential (primary) hypertension: Secondary | ICD-10-CM

## 2019-07-30 DIAGNOSIS — Z87442 Personal history of urinary calculi: Secondary | ICD-10-CM

## 2019-07-30 DIAGNOSIS — Z1159 Encounter for screening for other viral diseases: Secondary | ICD-10-CM

## 2019-07-30 DIAGNOSIS — Z7189 Other specified counseling: Secondary | ICD-10-CM

## 2019-07-30 DIAGNOSIS — R0989 Other specified symptoms and signs involving the circulatory and respiratory systems: Secondary | ICD-10-CM

## 2019-07-30 DIAGNOSIS — D751 Secondary polycythemia: Secondary | ICD-10-CM | POA: Diagnosis not present

## 2019-07-30 DIAGNOSIS — E118 Type 2 diabetes mellitus with unspecified complications: Secondary | ICD-10-CM

## 2019-07-30 DIAGNOSIS — Z1211 Encounter for screening for malignant neoplasm of colon: Secondary | ICD-10-CM

## 2019-07-30 NOTE — Telephone Encounter (Signed)
Pt called back and wanted to see if you still wanted him to get the shingles shot? He said he is available any day this month to get it if so

## 2019-07-30 NOTE — Telephone Encounter (Signed)
If insurance covers this, then yes come in for first dose of 2 given 2 months aparat.
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
## 2019-07-30 NOTE — Telephone Encounter (Signed)
Pt advised.

## 2019-07-30 NOTE — Telephone Encounter (Signed)
Ok, have him do the first one at least.

## 2019-07-30 NOTE — Telephone Encounter (Signed)
We can do the 1st shot but the 2nd one wont be covered cause he will have Medicare next month

## 2019-07-31 LAB — CBC WITH DIFFERENTIAL/PLATELET
Basophils Absolute: 0.1 10*3/uL (ref 0.0–0.2)
Basos: 1 %
EOS (ABSOLUTE): 0.2 10*3/uL (ref 0.0–0.4)
Eos: 2 %
Hematocrit: 48.9 % (ref 37.5–51.0)
Hemoglobin: 16.8 g/dL (ref 13.0–17.7)
Immature Grans (Abs): 0 10*3/uL (ref 0.0–0.1)
Immature Granulocytes: 0 %
Lymphocytes Absolute: 2.7 10*3/uL (ref 0.7–3.1)
Lymphs: 28 %
MCH: 31.2 pg (ref 26.6–33.0)
MCHC: 34.4 g/dL (ref 31.5–35.7)
MCV: 91 fL (ref 79–97)
Monocytes Absolute: 0.9 10*3/uL (ref 0.1–0.9)
Monocytes: 9 %
Neutrophils Absolute: 5.6 10*3/uL (ref 1.4–7.0)
Neutrophils: 60 %
Platelets: 338 10*3/uL (ref 150–450)
RBC: 5.38 x10E6/uL (ref 4.14–5.80)
RDW: 12.6 % (ref 11.6–15.4)
WBC: 9.4 10*3/uL (ref 3.4–10.8)

## 2019-07-31 LAB — MICROALBUMIN / CREATININE URINE RATIO
Creatinine, Urine: 144.3 mg/dL
Microalb/Creat Ratio: 24 mg/g creat (ref 0–29)
Microalbumin, Urine: 34.5 ug/mL

## 2019-07-31 LAB — COMPREHENSIVE METABOLIC PANEL
ALT: 28 IU/L (ref 0–44)
AST: 19 IU/L (ref 0–40)
Albumin/Globulin Ratio: 1.9 (ref 1.2–2.2)
Albumin: 5 g/dL — ABNORMAL HIGH (ref 3.8–4.8)
Alkaline Phosphatase: 53 IU/L (ref 39–117)
BUN/Creatinine Ratio: 12 (ref 10–24)
BUN: 13 mg/dL (ref 8–27)
Bilirubin Total: 0.6 mg/dL (ref 0.0–1.2)
CO2: 22 mmol/L (ref 20–29)
Calcium: 10.1 mg/dL (ref 8.6–10.2)
Chloride: 100 mmol/L (ref 96–106)
Creatinine, Ser: 1.06 mg/dL (ref 0.76–1.27)
GFR calc Af Amer: 85 mL/min/{1.73_m2} (ref >59)
GFR calc non Af Amer: 73 mL/min/{1.73_m2} (ref >59)
Globulin, Total: 2.7 g/dL (ref 1.5–4.5)
Glucose: 182 mg/dL — ABNORMAL HIGH (ref 65–99)
Potassium: 4.8 mmol/L (ref 3.5–5.2)
Sodium: 138 mmol/L (ref 134–144)
Total Protein: 7.7 g/dL (ref 6.0–8.5)

## 2019-07-31 LAB — LIPID PANEL
Chol/HDL Ratio: 8.7 ratio — ABNORMAL HIGH (ref 0.0–5.0)
Cholesterol, Total: 260 mg/dL — ABNORMAL HIGH (ref 100–199)
HDL: 30 mg/dL — ABNORMAL LOW (ref >39)
LDL Chol Calc (NIH): 154 mg/dL — ABNORMAL HIGH (ref 0–99)
Triglycerides: 399 mg/dL — ABNORMAL HIGH (ref 0–149)
VLDL Cholesterol Cal: 76 mg/dL — ABNORMAL HIGH (ref 5–40)

## 2019-07-31 LAB — HEPATITIS C ANTIBODY: Hep C Virus Ab: <0.1 s/co ratio (ref 0.0–0.9)

## 2019-07-31 LAB — HEMOGLOBIN A1C
Est. average glucose Bld gHb Est-mCnc: 203 mg/dL
Hgb A1c MFr Bld: 8.7 % — ABNORMAL HIGH (ref 4.8–5.6)

## 2019-07-31 LAB — PSA: Prostate Specific Ag, Serum: 0.8 ng/mL (ref 0.0–4.0)

## 2019-08-02 ENCOUNTER — Other Ambulatory Visit: Payer: Self-pay | Admitting: Medical

## 2019-08-02 MED ORDER — FARXIGA 5 MG PO TABS: 5.0000 mg | ORAL_TABLET | Freq: Every day | ORAL | 1 refills | Status: DC

## 2019-08-02 MED ORDER — METFORMIN HCL 500 MG PO TABS: 500.0000 mg | ORAL_TABLET | Freq: Three times a day (TID) | ORAL | 1 refills | Status: DC

## 2019-08-02 MED ORDER — ASPIRIN EC 81 MG PO TBEC: 81.0000 mg | DELAYED_RELEASE_TABLET | Freq: Every day | ORAL | 3 refills | Status: DC

## 2019-08-02 MED ORDER — ROSUVASTATIN CALCIUM 10 MG PO TABS: 10.0000 mg | ORAL_TABLET | Freq: Every day | ORAL | 1 refills | Status: DC

## 2019-08-03 ENCOUNTER — Telehealth: Payer: Self-pay

## 2019-08-03 MED ORDER — GLUCOSE BLOOD VI STRP: ORAL_STRIP | 12 refills | Status: DC

## 2019-08-03 NOTE — Telephone Encounter (Signed)
Patient called to request test strips for Contour Next One meter

## 2020-02-02 DIAGNOSIS — F410 Panic disorder [episodic paroxysmal anxiety] without agoraphobia: Secondary | ICD-10-CM | POA: Diagnosis not present

## 2020-04-19 ENCOUNTER — Other Ambulatory Visit: Payer: Self-pay | Admitting: Medical

## 2020-04-19 ENCOUNTER — Telehealth: Payer: Self-pay | Admitting: Medical

## 2020-04-19 MED ORDER — ROSUVASTATIN CALCIUM 10 MG PO TABS: 10.0000 mg | ORAL_TABLET | Freq: Every day | ORAL | 0 refills | Status: DC

## 2020-04-19 MED ORDER — METFORMIN HCL 500 MG PO TABS: 500.0000 mg | ORAL_TABLET | Freq: Three times a day (TID) | ORAL | 0 refills | Status: DC

## 2020-04-19 NOTE — Telephone Encounter (Signed)
Pt called and needs a refill on Crestor and metformin sent to [facility]. Pt has a diabetes appt with you next week but will be out of this medication before then

## 2020-04-26 ENCOUNTER — Ambulatory Visit (INDEPENDENT_AMBULATORY_CARE_PROVIDER_SITE_OTHER): Payer: BC Managed Care – PPO | Admitting: Medical

## 2020-04-26 ENCOUNTER — Encounter: Payer: Self-pay | Admitting: Medical

## 2020-04-26 ENCOUNTER — Other Ambulatory Visit: Payer: Self-pay

## 2020-04-26 VITALS — BP 140/82 | HR 86 | Ht 74.0 in | Wt 212.0 lb

## 2020-04-26 DIAGNOSIS — I10 Essential (primary) hypertension: Secondary | ICD-10-CM

## 2020-04-26 DIAGNOSIS — E785 Hyperlipidemia, unspecified: Secondary | ICD-10-CM | POA: Diagnosis not present

## 2020-04-26 DIAGNOSIS — E118 Type 2 diabetes mellitus with unspecified complications: Secondary | ICD-10-CM | POA: Diagnosis not present

## 2020-04-26 DIAGNOSIS — Z1211 Encounter for screening for malignant neoplasm of colon: Secondary | ICD-10-CM

## 2020-04-26 DIAGNOSIS — Z7185 Encounter for immunization safety counseling: Secondary | ICD-10-CM

## 2020-04-26 DIAGNOSIS — E639 Nutritional deficiency, unspecified: Secondary | ICD-10-CM

## 2020-04-26 DIAGNOSIS — I2510 Atherosclerotic heart disease of native coronary artery without angina pectoris: Secondary | ICD-10-CM

## 2020-04-26 DIAGNOSIS — Z7189 Other specified counseling: Secondary | ICD-10-CM

## 2020-04-26 NOTE — Addendum Note (Signed)
Addended by: Edgar Frisk on: 04/26/2020 03:18 PM   Modules accepted: Orders

## 2020-04-26 NOTE — Progress Notes (Addendum)
Subjective: Chief Complaint  Patient presents with  . Diabetes    with fasting labs   Here for med check  Diabetes-last visit not at goal so we added [place].  He is taking [place] and taking his Metformin as usual.  He notes he had trouble getting the pharmacy to refill strips.  He has more than 1 glucometer at home and is not sure which one to use.  He is not checking his sugars regularly.  No hyperglycemia, no hypoglycemia, no blurred vision.  No foot concerns  Hyperlipidemia-compliant with Crestor which was started last visit and we discontinue Lipitor.  No compliant with medication  He notes that although we talked about Cologuard last time he never got a call about this.  He is still working down in [place] for 6 to 7 months at a time.  He is a pipe fitter working on drainage pipes.  There is no working kitchen where he is having to stay.   So he relies on microwavable foods and nonperishable foods and vending machine  Hypertension-compliant medication.  Not checking home blood pressures.  No chest pain no palpitations no swelling no shortness of breath.  He is not getting exercise.  It is not safe to be outside exercising where he is working in stationed for several months at a time.    Past Medical History:  Diagnosis Date  . Anxiety   . CAD (coronary artery disease)    a. 10/2012: inferolateral wall ST elevation MI s/p stent to distal LCx 10/29/12 (Stentys Apposition Trial randomizing him to a self-expanding bare-metal stent versus a traditional balloon expandable bare-metal coronary stent).   . Diabetes mellitus ([place])   . Former smoker   . Hyperlipidemia   . Hypertension   . Leukocytosis   . Polycythemia    a. patient reports hx of therapeutic phlebotomy in the 1970s => HCT eventually stabilized and he no longer required phlebotomy   Current Outpatient Medications on File Prior to Visit  Medication Sig Dispense Refill  . ALPRAZolam (XANAX) 0.5 MG tablet Take 0.5 mg by mouth  4 (four) times daily as needed. Reported on 03/01/2016    . ALPRAZolam (XANAX) 1 MG tablet Take 1 mg by mouth 4 (four) times daily.    Marland Kitchen aspirin EC 81 MG tablet Take 1 tablet (81 mg total) by mouth daily. 90 tablet 3  . carvedilol (COREG) 6.25 MG tablet Take 1 tablet (6.25 mg total) by mouth 2 (two) times daily with a meal. 180 tablet 3  . dapagliflozin propanediol (FARXIGA) 5 MG TABS tablet Take 5 mg by mouth daily before breakfast. 90 tablet 1  . glucose blood test strip Patient has Contour Next One meter. Please give test strips for this meter to test 1-2 times daily 100 each 12  . lisinopril (PRINIVIL) 10 MG tablet Take 1 tablet (10 mg total) by mouth daily. 90 tablet 3  . metFORMIN (GLUCOPHAGE) 500 MG tablet Take 1 tablet (500 mg total) by mouth 3 (three) times daily. 90 tablet 0  . rosuvastatin (CRESTOR) 10 MG tablet Take 1 tablet (10 mg total) by mouth at bedtime. 30 tablet 0  . sildenafil (VIAGRA) 50 MG tablet Take 1 tablet (50 mg total) by mouth daily as needed for erectile dysfunction. 12 tablet 0   No current facility-administered medications on file prior to visit.   ROS as in subjective  Objective BP 140/82   Pulse 86   Ht 6\' 2"  (1.88 m)   Wt 212  lb (96.2 kg)   SpO2 97%   BMI 27.22 kg/m   General appearence: alert, no distress, WD/WN,  Neck: supple, no lymphadenopathy, no thyromegaly, no masses, no bruits Heart: RRR, normal S1, S2, no murmurs Lungs: CTA bilaterally, no wheezes, rhonchi, or rales Abdomen: +bs, soft, non tender, non distended, no masses, no hepatomegaly, no splenomegaly Pulses: 2+ symmetric, upper and lower extremities, normal cap refill   Diabetic Foot Exam - Simple   Simple Foot Form Diabetic Foot exam was performed with the following findings: Yes 04/26/2020  1:22 PM  Visual Inspection See comments: Yes Sensation Testing Intact to touch and monofilament testing bilaterally: Yes Pulse Check Posterior Tibialis and Dorsalis pulse intact bilaterally:  Yes Comments Yellowish exam of bilateral great toenails otherwise feet exam unremarkable      Assessment: Encounter Diagnoses  Name Primary?  . Diabetes mellitus with complication in adult patient ([address]) Yes  . Essential hypertension   . Atherosclerosis of native coronary artery of native heart without angina pectoris   . Hyperlipidemia, unspecified hyperlipidemia type   . Vaccine counseling   . Poor diet      Plan: Diabetes-we discussed possibly trying to get the continuous glucose monitor such as freestyle libre.   We will check on this.  I did write separate prescriptions for regular glucometer to strips and lancets but also a separate prescription for the freestyle libre.  He will check at pharmacy.  We discussed importance of glucose monitoring, compliance, routine follow-up and labs  Hypertension-not at goal.  Increase lisinopril to 20 mg daily.  Continue carvedilol  Hyperlipidemia-continue statin, recheck labs today.  We changed to Crestor last visit  Counseled on the need to get a better eating situation and need for exercise.  We discussed food services that deliver food which she could use along with a skillet or plug-in stove top there.  We tried to discuss other solutions to help with his diet and food choices  Counseled on the need for Prevnar 13, shingles vaccine and yearly flu shot.  He declines these today.  He plans to get flu shot at his wife's employment where he does this yearly.  He notes having Covid vaccine already   Leeandre was seen today for diabetes.  Diagnoses and all orders for this visit:  Diabetes mellitus with complication in adult patient [facility]) -     Comprehensive metabolic panel -     Hemoglobin A1c  Essential hypertension -     Comprehensive metabolic panel  Atherosclerosis of native coronary artery of native heart without angina pectoris -     Comprehensive metabolic panel -     Lipid panel  Hyperlipidemia, unspecified hyperlipidemia  type -     Comprehensive metabolic panel -     Lipid panel  Vaccine counseling  Poor diet  Follow-up pending labs

## 2020-04-27 ENCOUNTER — Other Ambulatory Visit: Payer: Self-pay | Admitting: Medical

## 2020-04-27 DIAGNOSIS — I2510 Atherosclerotic heart disease of native coronary artery without angina pectoris: Secondary | ICD-10-CM

## 2020-04-27 LAB — COMPREHENSIVE METABOLIC PANEL
ALT: 43 IU/L (ref 0–44)
AST: 34 IU/L (ref 0–40)
Albumin/Globulin Ratio: 1.8 (ref 1.2–2.2)
Albumin: 5.1 g/dL — ABNORMAL HIGH (ref 3.8–4.8)
Alkaline Phosphatase: 59 IU/L (ref 48–121)
BUN/Creatinine Ratio: 15 (ref 10–24)
BUN: 13 mg/dL (ref 8–27)
Bilirubin Total: 0.6 mg/dL (ref 0.0–1.2)
CO2: 21 mmol/L (ref 20–29)
Calcium: 10 mg/dL (ref 8.6–10.2)
Chloride: 99 mmol/L (ref 96–106)
Creatinine, Ser: 0.88 mg/dL (ref 0.76–1.27)
GFR calc Af Amer: 103 mL/min/{1.73_m2} (ref >59)
GFR calc non Af Amer: 90 mL/min/{1.73_m2} (ref >59)
Globulin, Total: 2.8 g/dL (ref 1.5–4.5)
Glucose: 188 mg/dL — ABNORMAL HIGH (ref 65–99)
Potassium: 4.4 mmol/L (ref 3.5–5.2)
Sodium: 137 mmol/L (ref 134–144)
Total Protein: 7.9 g/dL (ref 6.0–8.5)

## 2020-04-27 LAB — HEMOGLOBIN A1C
Est. average glucose Bld gHb Est-mCnc: 240 mg/dL
Hgb A1c MFr Bld: 10 % — ABNORMAL HIGH (ref 4.8–5.6)

## 2020-04-27 LAB — LIPID PANEL
Chol/HDL Ratio: 5.6 ratio — ABNORMAL HIGH (ref 0.0–5.0)
Cholesterol, Total: 180 mg/dL (ref 100–199)
HDL: 32 mg/dL — ABNORMAL LOW (ref >39)
LDL Chol Calc (NIH): 87 mg/dL (ref 0–99)
Triglycerides: 368 mg/dL — ABNORMAL HIGH (ref 0–149)
VLDL Cholesterol Cal: 61 mg/dL — ABNORMAL HIGH (ref 5–40)

## 2020-04-27 MED ORDER — CARVEDILOL 6.25 MG PO TABS: 6.2500 mg | ORAL_TABLET | Freq: Two times a day (BID) | ORAL | 0 refills | Status: DC

## 2020-04-27 MED ORDER — METFORMIN HCL 500 MG PO TABS: 500.0000 mg | ORAL_TABLET | Freq: Three times a day (TID) | ORAL | 3 refills | Status: DC

## 2020-04-27 MED ORDER — LISINOPRIL 20 MG PO TABS: 20.0000 mg | ORAL_TABLET | Freq: Every day | ORAL | 3 refills | Status: DC

## 2020-04-27 MED ORDER — ROSUVASTATIN CALCIUM 10 MG PO TABS: 10.0000 mg | ORAL_TABLET | Freq: Every day | ORAL | 3 refills | Status: DC

## 2020-04-27 MED ORDER — DAPAGLIFLOZIN PROPANEDIOL 10 MG PO TABS: 10.0000 mg | ORAL_TABLET | Freq: Every day | ORAL | 0 refills | Status: DC

## 2020-04-28 ENCOUNTER — Telehealth: Payer: Self-pay

## 2020-04-28 ENCOUNTER — Other Ambulatory Visit: Payer: Self-pay | Admitting: Medical

## 2020-04-28 DIAGNOSIS — I2510 Atherosclerotic heart disease of native coronary artery without angina pectoris: Secondary | ICD-10-CM

## 2020-04-28 MED ORDER — ROSUVASTATIN CALCIUM 10 MG PO TABS: 10.0000 mg | ORAL_TABLET | Freq: Every day | ORAL | 3 refills | Status: DC

## 2020-04-28 MED ORDER — LISINOPRIL 20 MG PO TABS: 20.0000 mg | ORAL_TABLET | Freq: Every day | ORAL | 3 refills | Status: DC

## 2020-04-28 MED ORDER — ASPIRIN EC 81 MG PO TBEC: 81.0000 mg | DELAYED_RELEASE_TABLET | Freq: Every day | ORAL | 3 refills | Status: DC

## 2020-04-28 MED ORDER — METFORMIN HCL 500 MG PO TABS: 500.0000 mg | ORAL_TABLET | Freq: Three times a day (TID) | ORAL | 3 refills | Status: DC

## 2020-04-28 MED ORDER — CARVEDILOL 6.25 MG PO TABS: 6.2500 mg | ORAL_TABLET | Freq: Two times a day (BID) | ORAL | 0 refills | Status: DC

## 2020-04-28 MED ORDER — EMPAGLIFLOZIN 25 MG PO TABS: 25.0000 mg | ORAL_TABLET | Freq: Every day | ORAL | 2 refills | Status: DC

## 2020-04-28 MED ORDER — SILDENAFIL CITRATE 50 MG PO TABS: 50.0000 mg | ORAL_TABLET | Freq: Every day | ORAL | 2 refills | Status: DC | PRN

## 2020-04-28 NOTE — Telephone Encounter (Signed)
Pt. Called stating that the farxiga that was called in is too expensive its 100 dollars for a 30 day supply they mentioned a discount card for that medicine he wanted to know if we had one here. He also wanted to know if you could send in a refill on the pts. Sildenafil to the [facility] on [address]. He also said they did not have his Metformin or Crestor prescriptions I told him they were both sent in yesterday and they should be there.

## 2020-04-28 NOTE — Telephone Encounter (Signed)
He is correct I did not send those to [facility] because chart record showed [facility].   If he specifically said [facility] I apologize.  The pharmacy information is supposed to be put in at triage, so if he did not tell us anything different it went to [facility].  I did re-send everything today to [facility]  See if we have a coupon card we can give him.  He may have to call his insurance to figure out which is their preferred drug in this class if Wilder Glade is not covered.  I sent Jardiance as an alternative which is a similar drug.

## 2020-05-08 ENCOUNTER — Other Ambulatory Visit: Payer: Self-pay | Admitting: Medical

## 2020-05-08 MED ORDER — RYBELSUS 3 MG PO TABS: 1.0000 | ORAL_TABLET | Freq: Every day | ORAL | 2 refills | Status: DC

## 2020-05-09 ENCOUNTER — Telehealth: Payer: Self-pay | Admitting: Family Medicine

## 2020-05-09 ENCOUNTER — Other Ambulatory Visit: Payer: Self-pay | Admitting: Family Medicine

## 2020-05-09 ENCOUNTER — Encounter: Payer: Self-pay | Admitting: Medical

## 2020-05-09 NOTE — Telephone Encounter (Signed)
Discussed with [name], the patient has never taken [place], he could not afford. [name] has added Jardiance and the patient has been on it 4 days and thinks it may be working good. [name] will not be adding Rybelsus due to cost and the fact that the patient had led [name] to believe he was taking [place]. We will not send letter to patient since we are updating the info. [name] has increased the Lisinopril. [name] recommends patient bring all pill bottles to visits.  I will call patient and explain same

## 2020-05-09 NOTE — Telephone Encounter (Signed)
Pt called, he is confused on his medications.  He states when he talked with Delfino Lovett, she told him he was taking [place], pt states he is not taking [place] as it was over 800.00.  So you gave him Jardiance and with a coupon it is only costing him 10.00.  He has been on that for 4 days and he thinks it is working.  He received a call from Pharmacy to pick up Rybelsus, he is not sure why.  He states that is over 800.00 and he wants to wait and see if the Vania Rea is going to work.  Can you tell him what the Rybelsus is for?  Pt states he will be retired in about 52months and then he can exercise regularly.  He is getting a treadmill in [place] and a hot plate for meals.  Is Stevia in the raw a better substitute than the other sweeteners?  Please advise and I will call patient back. [phone]

## 2020-05-09 NOTE — Telephone Encounter (Signed)
Please mail the letter I typed today.   With the recent lab message, I offered to have him come in and discuss the lab findings.  He may still want to do that if really confused.    I typed the recommendations and tried to clarify on paper, so please mail the letter.   If the Rybelsus was the medication that was expensive, I will need Mickel Baas to try and work a prior authorization.  We had also changed from [place] to Iva although he was on Farxiga prior without issue with cost.   So not sure if the Rybelsus or the Jardiance or Wilder Glade was the expensive medication.  Could have been either.   [name]

## 2020-05-10 ENCOUNTER — Telehealth: Payer: Self-pay | Admitting: Family Medicine

## 2020-05-10 ENCOUNTER — Other Ambulatory Visit: Payer: Self-pay | Admitting: Medical

## 2020-05-10 NOTE — Telephone Encounter (Signed)
Thanks for clearing up the confusion

## 2020-05-10 NOTE — Telephone Encounter (Signed)
Called pt and advised him of everything.  Advised pt to bring pills next visit.  He said he thinks Daniel Lucas is working.  His sugar is now 170 from 188.  He will keep trying to do better.

## 2020-05-15 DIAGNOSIS — Z1211 Encounter for screening for malignant neoplasm of colon: Secondary | ICD-10-CM | POA: Diagnosis not present

## 2020-05-16 LAB — COLOGUARD: Cologuard: NEGATIVE

## 2020-05-18 LAB — COLOGUARD: COLOGUARD: NEGATIVE

## 2020-05-23 ENCOUNTER — Encounter: Payer: Self-pay | Admitting: Medical

## 2020-07-31 ENCOUNTER — Other Ambulatory Visit: Payer: Self-pay | Admitting: Medical

## 2020-07-31 DIAGNOSIS — I2510 Atherosclerotic heart disease of native coronary artery without angina pectoris: Secondary | ICD-10-CM

## 2020-08-02 ENCOUNTER — Telehealth: Payer: Self-pay | Admitting: Family Medicine

## 2020-08-02 NOTE — Telephone Encounter (Signed)
Pt called and needs Jardiance refills for another 3 months as he is still in [place].  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  Please send to [facility] on [address].  Pt states numbers are running better.

## 2020-08-03 ENCOUNTER — Other Ambulatory Visit: Payer: Self-pay

## 2020-08-03 MED ORDER — EMPAGLIFLOZIN 25 MG PO TABS
25.0000 mg | ORAL_TABLET | Freq: Every day | ORAL | 2 refills | Status: DC
Start: 2020-08-03 — End: 2021-04-09

## 2020-08-03 NOTE — Telephone Encounter (Signed)
Medication has been sent to the pharmacy. 

## 2020-09-19 DIAGNOSIS — F410 Panic disorder [episodic paroxysmal anxiety] without agoraphobia: Secondary | ICD-10-CM | POA: Diagnosis not present

## 2021-03-27 DIAGNOSIS — F410 Panic disorder [episodic paroxysmal anxiety] without agoraphobia: Secondary | ICD-10-CM | POA: Diagnosis not present

## 2021-04-06 ENCOUNTER — Telehealth: Payer: Self-pay | Admitting: Internal Medicine

## 2021-04-06 NOTE — Telephone Encounter (Signed)
Left message for call back to schedule an appt for Daniel Lucas

## 2021-04-09 ENCOUNTER — Telehealth: Payer: Self-pay | Admitting: Medical

## 2021-04-09 MED ORDER — EMPAGLIFLOZIN 25 MG PO TABS
25.0000 mg | ORAL_TABLET | Freq: Every day | ORAL | 0 refills | Status: DC
Start: 2021-04-09 — End: 2021-04-10

## 2021-04-09 NOTE — Telephone Encounter (Signed)
Pt called and made an appt for next month. He states that he is still working [place]. He is requesting refills of Jarduance. He has been out. He states to call into [facility] on [address] and his wife will bring to him when he comes down.

## 2021-04-09 NOTE — Telephone Encounter (Signed)
do

## 2021-04-09 NOTE — Telephone Encounter (Signed)
done

## 2021-04-10 ENCOUNTER — Telehealth: Payer: Self-pay | Admitting: Family Medicine

## 2021-04-10 MED ORDER — EMPAGLIFLOZIN 25 MG PO TABS
25.0000 mg | ORAL_TABLET | Freq: Every day | ORAL | 1 refills | Status: DC
Start: 2021-04-10 — End: 2021-05-23

## 2021-04-10 NOTE — Telephone Encounter (Signed)
done

## 2021-04-10 NOTE — Telephone Encounter (Signed)
Daniel Lucas called he will be here for appt 9/28, his Jardiance refill will not cover him to that date.  We will give him an additional refill.  Daniel Lucas plans to start working only 6 month shifts so he can take better care of himself and see his doctors.  Please add 1 refill to the Jardiance.

## 2021-05-23 ENCOUNTER — Encounter: Payer: Self-pay | Admitting: Medical

## 2021-05-23 ENCOUNTER — Ambulatory Visit: Payer: BC Managed Care – PPO | Admitting: Medical

## 2021-05-23 ENCOUNTER — Other Ambulatory Visit: Payer: Self-pay

## 2021-05-23 VITALS — BP 136/82 | HR 77 | Wt 204.2 lb

## 2021-05-23 DIAGNOSIS — E118 Type 2 diabetes mellitus with unspecified complications: Secondary | ICD-10-CM | POA: Diagnosis not present

## 2021-05-23 DIAGNOSIS — I10 Essential (primary) hypertension: Secondary | ICD-10-CM

## 2021-05-23 DIAGNOSIS — R0989 Other specified symptoms and signs involving the circulatory and respiratory systems: Secondary | ICD-10-CM

## 2021-05-23 DIAGNOSIS — Z125 Encounter for screening for malignant neoplasm of prostate: Secondary | ICD-10-CM | POA: Insufficient documentation

## 2021-05-23 DIAGNOSIS — Z7185 Encounter for immunization safety counseling: Secondary | ICD-10-CM

## 2021-05-23 DIAGNOSIS — Z87891 Personal history of nicotine dependence: Secondary | ICD-10-CM | POA: Diagnosis not present

## 2021-05-23 DIAGNOSIS — E785 Hyperlipidemia, unspecified: Secondary | ICD-10-CM

## 2021-05-23 DIAGNOSIS — D751 Secondary polycythemia: Secondary | ICD-10-CM

## 2021-05-23 DIAGNOSIS — I2510 Atherosclerotic heart disease of native coronary artery without angina pectoris: Secondary | ICD-10-CM

## 2021-05-23 DIAGNOSIS — Z23 Encounter for immunization: Secondary | ICD-10-CM

## 2021-05-23 DIAGNOSIS — R770 Abnormality of albumin: Secondary | ICD-10-CM | POA: Diagnosis not present

## 2021-05-23 LAB — POCT GLYCOSYLATED HEMOGLOBIN (HGB A1C): Hemoglobin A1C: 7.8 % — AB (ref 4.0–5.6)

## 2021-05-23 MED ORDER — ASPIRIN EC 81 MG PO TBEC: 81.0000 mg | DELAYED_RELEASE_TABLET | Freq: Every day | ORAL | 3 refills | Status: DC

## 2021-05-23 MED ORDER — SILDENAFIL CITRATE 50 MG PO TABS: 50.0000 mg | ORAL_TABLET | Freq: Every day | ORAL | 2 refills | Status: DC | PRN

## 2021-05-23 MED ORDER — EMPAGLIFLOZIN 25 MG PO TABS: 25.0000 mg | ORAL_TABLET | Freq: Every day | ORAL | 3 refills | Status: DC

## 2021-05-23 MED ORDER — METFORMIN HCL 500 MG PO TABS: 500.0000 mg | ORAL_TABLET | Freq: Three times a day (TID) | ORAL | 3 refills | Status: DC

## 2021-05-23 MED ORDER — ROSUVASTATIN CALCIUM 20 MG PO TABS: 20.0000 mg | ORAL_TABLET | Freq: Every day | ORAL | 3 refills | Status: DC

## 2021-05-23 NOTE — Progress Notes (Addendum)
Subjective:  Daniel Lucas is a 65 y.o. male who presents for Chief Complaint  Patient presents with   diabetes    Diabetes,question about some stevia sugar      Medical Team: Dr. Sherren Mocha and Richardson Dopp, PA with cardiology Jeannifer Drakeford, Camelia Eng, PA-C here with primary care   Here for chronic disease follow up  HTN - compliant with Coreg 6.25mg  BID, Lisinopril 20mg  daily.  Home BP - not checking.  No chest pain, no palpations, no edema.  He notes calling cardiology earlier this year and he notes they said he didn't need follow up now  Hyperlipidemia - compliant with Crestor 10mg  daily and aspirin daily.   No adverse effect reported  Diabetes - compliant with metformin 500mg  TID, Jardiance 25mg  daily. Last year we talked about freestyle libre, and says he couldn't find it at any drug store.  Been checking every few days.   Sugars up and down, not at goal.    He notes having flu shot through work and covid booster within past year  No other aggravating or relieving factors.    No other c/o.  Past Medical History:  Diagnosis Date   Anxiety    CAD (coronary artery disease)    a. 10/2012: inferolateral wall ST elevation MI s/p stent to distal LCx 10/29/12 (Stentys Apposition Trial randomizing him to a self-expanding bare-metal stent versus a traditional balloon expandable bare-metal coronary stent).    Diabetes mellitus ([place])    Former smoker    Hyperlipidemia    Hypertension    Leukocytosis    Polycythemia    a. patient reports hx of therapeutic phlebotomy in the 1970s => HCT eventually stabilized and he no longer required phlebotomy   Current Outpatient Medications on File Prior to Visit  Medication Sig Dispense Refill   carvedilol (COREG) 6.25 MG tablet TAKE 1 TABLET BY MOUTH TWICE DAILY WITH A MEAL 180 tablet 0   glucose blood test strip Patient has Contour Next One meter. Please give test strips for this meter to test 1-2 times daily 100 each 12   lisinopril (ZESTRIL)  20 MG tablet Take 1 tablet (20 mg total) by mouth daily. 90 tablet 3   ALPRAZolam (XANAX) 1 MG tablet Take 1 mg by mouth 4 (four) times daily as needed.     No current facility-administered medications on file prior to visit.     The following portions of the patient's history were reviewed and updated as appropriate: allergies, current medications, past family history, past medical history, past social history, past surgical history and problem list.  ROS Otherwise as in subjective above    Objective: BP 136/82   Pulse 77   Wt 204 lb 3.2 oz (92.6 kg)   BMI 26.22 kg/m   BP Readings from Last 3 Encounters:  05/23/21 136/82  04/26/20 140/82  07/30/19 140/86    General appearance: alert, no distress, well developed, well nourished Neck: supple, no lymphadenopathy, no thyromegaly, no masses, no bruits Heart: RRR, normal S1, S2, no murmurs Lungs: CTA bilaterally, no wheezes, rhonchi, or rales Pulses: 2+ radial pulses, 2+ pedal pulses, normal cap refill Ext: no edema  Diabetic Foot Exam - Simple   Simple Foot Form Diabetic Foot exam was performed with the following findings: Yes 05/23/2021 11:02 AM  Visual Inspection No deformities, no ulcerations, no other skin breakdown bilaterally: Yes Sensation Testing Intact to touch and monofilament testing bilaterally: Yes Pulse Check Posterior Tibialis and Dorsalis pulse intact bilaterally: Yes Comments  Assessment: Encounter Diagnoses  Name Primary?   Atherosclerosis of native coronary artery of native heart without angina pectoris    Decreased pulses in feet    Diabetes mellitus with complication in adult patient [facility])    Former smoker    Primary hypertension    Hyperlipidemia, unspecified hyperlipidemia type    Polycythemia    Vaccine counseling    Screening for prostate cancer    Need for pneumococcal vaccination Yes     Plan: Diabetes  HgbA1C 7.8%.   Discussed possible medicaiton options.  He notes having  been out of Jardiance recently at the pharmacy Continue metformin TID, Jardiance Continue checking glucose most days per week, goal < 130 fasting Check feet daily for wounds, sores See eye doctor yearly for diabetic eye exam  Hypertension  Not at goal Continue Lisinopril 20mg  daily Continue Coreg 6.25mg  BID Pending labs, consider adding HCTZ or Amlodipine  Hyperlipidemia, atherosclerosis Labs today Continue statin and aspirin, but increase Crestor to 20mg  daily.  Polycythemia - has been stable, recheck labs today  Vaccine counseling: Recommended flu vaccine, pneumococcal vaccine up date, Shingrix, covid boosters  Shingles vaccine:  I recommend you have a shingles vaccine to help prevent shingles or herpes zoster outbreak.   Please call your insurer to inquire about coverage for the Shingrix vaccine given in 2 doses.   Some insurers cover this vaccine after age 4, some cover this after age 33.  If your insurer covers this, then call to schedule appointment to have this vaccine here.  He notes having flu vaccine 04/26/21 through work health care worker  He also notes having covid boosters within past year.    Counseled on the pneumococcal vaccine.  Vaccine information sheet given.  Pneumococcal vaccine  given after consent obtained.    Bodie was seen today for diabetes.  Diagnoses and all orders for this visit:  Need for pneumococcal vaccination -     Pneumococcal conjugate vaccine 13-valent  Atherosclerosis of native coronary artery of native heart without angina pectoris -     Lipid panel  Decreased pulses in feet  Diabetes mellitus with complication in adult patient ([place]) -     Comprehensive metabolic panel -     Microalbumin/Creatinine Ratio, Urine -     HgB A1c  Former smoker  Primary hypertension -     Comprehensive metabolic panel  Hyperlipidemia, unspecified hyperlipidemia type -     Comprehensive metabolic panel -     Lipid panel  Polycythemia -     CBC  with Differential/Platelet  Vaccine counseling  Screening for prostate cancer -     PSA  Other orders -     rosuvastatin (CRESTOR) 20 MG tablet; Take 1 tablet (20 mg total) by mouth daily. -     empagliflozin (JARDIANCE) 25 MG TABS tablet; Take 1 tablet (25 mg total) by mouth daily before breakfast. -     aspirin EC 81 MG tablet; Take 1 tablet (81 mg total) by mouth daily. -     metFORMIN (GLUCOPHAGE) 500 MG tablet; Take 1 tablet (500 mg total) by mouth 3 (three) times daily. -     sildenafil (VIAGRA) 50 MG tablet; Take 1 tablet (50 mg total) by mouth daily as needed for erectile dysfunction.  Follow up: pending labs

## 2021-05-23 NOTE — Addendum Note (Signed)
Addended byLinus Salmons, Wreatha Sturgeon D on: 05/23/2021 11:37 AM   Modules accepted: Orders

## 2021-05-24 ENCOUNTER — Encounter: Payer: Self-pay | Admitting: Internal Medicine

## 2021-05-24 ENCOUNTER — Other Ambulatory Visit: Payer: Self-pay | Admitting: Medical

## 2021-05-24 DIAGNOSIS — I2510 Atherosclerotic heart disease of native coronary artery without angina pectoris: Secondary | ICD-10-CM

## 2021-05-24 LAB — CBC WITH DIFFERENTIAL/PLATELET
Basophils Absolute: 0.1 10*3/uL (ref 0.0–0.2)
Basos: 1 %
EOS (ABSOLUTE): 0.5 10*3/uL — ABNORMAL HIGH (ref 0.0–0.4)
Eos: 5 %
Hematocrit: 49.6 % (ref 37.5–51.0)
Hemoglobin: 16.6 g/dL (ref 13.0–17.7)
Immature Grans (Abs): 0 10*3/uL (ref 0.0–0.1)
Immature Granulocytes: 0 %
Lymphocytes Absolute: 2.9 10*3/uL (ref 0.7–3.1)
Lymphs: 32 %
MCH: 31.1 pg (ref 26.6–33.0)
MCHC: 33.5 g/dL (ref 31.5–35.7)
MCV: 93 fL (ref 79–97)
Monocytes Absolute: 0.8 10*3/uL (ref 0.1–0.9)
Monocytes: 9 %
Neutrophils Absolute: 5 10*3/uL (ref 1.4–7.0)
Neutrophils: 53 %
Platelets: 297 10*3/uL (ref 150–450)
RBC: 5.33 x10E6/uL (ref 4.14–5.80)
RDW: 12.4 % (ref 11.6–15.4)
WBC: 9.3 10*3/uL (ref 3.4–10.8)

## 2021-05-24 LAB — LIPID PANEL
Chol/HDL Ratio: 4.6 ratio (ref 0.0–5.0)
Cholesterol, Total: 162 mg/dL (ref 100–199)
HDL: 35 mg/dL — ABNORMAL LOW (ref >39)
LDL Chol Calc (NIH): 78 mg/dL (ref 0–99)
Triglycerides: 304 mg/dL — ABNORMAL HIGH (ref 0–149)
VLDL Cholesterol Cal: 49 mg/dL — ABNORMAL HIGH (ref 5–40)

## 2021-05-24 LAB — COMPREHENSIVE METABOLIC PANEL
ALT: 17 IU/L (ref 0–44)
AST: 17 IU/L (ref 0–40)
Albumin/Globulin Ratio: 2.5 — ABNORMAL HIGH (ref 1.2–2.2)
Albumin: 5.3 g/dL — ABNORMAL HIGH (ref 3.8–4.8)
Alkaline Phosphatase: 42 IU/L — ABNORMAL LOW (ref 44–121)
BUN/Creatinine Ratio: 14 (ref 10–24)
BUN: 14 mg/dL (ref 8–27)
Bilirubin Total: 0.5 mg/dL (ref 0.0–1.2)
CO2: 21 mmol/L (ref 20–29)
Calcium: 10.1 mg/dL (ref 8.6–10.2)
Chloride: 101 mmol/L (ref 96–106)
Creatinine, Ser: 1.01 mg/dL (ref 0.76–1.27)
Globulin, Total: 2.1 g/dL (ref 1.5–4.5)
Glucose: 119 mg/dL — ABNORMAL HIGH (ref 70–99)
Potassium: 5.3 mmol/L — ABNORMAL HIGH (ref 3.5–5.2)
Sodium: 140 mmol/L (ref 134–144)
Total Protein: 7.4 g/dL (ref 6.0–8.5)
eGFR: 82 mL/min/{1.73_m2} (ref >59)

## 2021-05-24 LAB — MICROALBUMIN / CREATININE URINE RATIO
Creatinine, Urine: 84.8 mg/dL
Microalb/Creat Ratio: 12 mg/g creat (ref 0–29)
Microalbumin, Urine: 9.8 ug/mL

## 2021-05-24 LAB — PSA: Prostate Specific Ag, Serum: 0.7 ng/mL (ref 0.0–4.0)

## 2021-05-24 MED ORDER — GEMFIBROZIL 600 MG PO TABS: 600.0000 mg | ORAL_TABLET | Freq: Two times a day (BID) | ORAL | 1 refills | Status: DC

## 2021-05-24 MED ORDER — VALSARTAN-HYDROCHLOROTHIAZIDE 160-12.5 MG PO TABS: 1.0000 | ORAL_TABLET | Freq: Every day | ORAL | 3 refills | Status: DC

## 2021-05-24 MED ORDER — CARVEDILOL 6.25 MG PO TABS: 6.2500 mg | ORAL_TABLET | Freq: Two times a day (BID) | ORAL | 3 refills | Status: DC

## 2021-05-25 ENCOUNTER — Telehealth: Payer: Self-pay | Admitting: Internal Medicine

## 2021-05-25 ENCOUNTER — Other Ambulatory Visit: Payer: Self-pay | Admitting: Medical

## 2021-05-25 DIAGNOSIS — I2510 Atherosclerotic heart disease of native coronary artery without angina pectoris: Secondary | ICD-10-CM

## 2021-05-25 MED ORDER — SILDENAFIL CITRATE 50 MG PO TABS: 50.0000 mg | ORAL_TABLET | Freq: Every day | ORAL | 2 refills | Status: DC | PRN

## 2021-05-25 MED ORDER — GEMFIBROZIL 600 MG PO TABS: 600.0000 mg | ORAL_TABLET | Freq: Two times a day (BID) | ORAL | 1 refills | Status: DC

## 2021-05-25 MED ORDER — NITROGLYCERIN 0.4 MG SL SUBL: 0.4000 mg | SUBLINGUAL_TABLET | SUBLINGUAL | 0 refills | Status: DC | PRN

## 2021-05-25 MED ORDER — METFORMIN HCL 500 MG PO TABS: 500.0000 mg | ORAL_TABLET | Freq: Three times a day (TID) | ORAL | 3 refills | Status: DC

## 2021-05-25 MED ORDER — ROSUVASTATIN CALCIUM 20 MG PO TABS: 20.0000 mg | ORAL_TABLET | Freq: Every day | ORAL | 3 refills | Status: DC

## 2021-05-25 MED ORDER — ASPIRIN EC 81 MG PO TBEC: 81.0000 mg | DELAYED_RELEASE_TABLET | Freq: Every day | ORAL | 3 refills | Status: DC

## 2021-05-25 MED ORDER — VALSARTAN-HYDROCHLOROTHIAZIDE 160-12.5 MG PO TABS: 1.0000 | ORAL_TABLET | Freq: Every day | ORAL | 3 refills | Status: DC

## 2021-05-25 MED ORDER — EMPAGLIFLOZIN 25 MG PO TABS: 25.0000 mg | ORAL_TABLET | Freq: Every day | ORAL | 3 refills | Status: DC

## 2021-05-25 MED ORDER — CARVEDILOL 6.25 MG PO TABS: 6.2500 mg | ORAL_TABLET | Freq: Two times a day (BID) | ORAL | 3 refills | Status: DC

## 2021-05-25 NOTE — Telephone Encounter (Signed)
Pt needs a refill on the following medications at specific locations.  [facility]- Metformin Carvediol   [facility] [address]  Jardiance- Crestor Valsartan-HCTZ Sildenfil Gemfibrozol Nitrogylcerin- Dr. Burt Knack refilled this back in 2019 for pt and he has been using old pills. He wants to know if you will refill this for him as you refill his bp meds- so no need for him to go to Dr. Burt Knack too. Please advise

## 2021-05-25 NOTE — Telephone Encounter (Signed)
Pt was notified.  

## 2021-05-28 ENCOUNTER — Other Ambulatory Visit: Payer: Self-pay | Admitting: Family Medicine

## 2021-05-28 ENCOUNTER — Telehealth: Payer: Self-pay | Admitting: Family Medicine

## 2021-05-28 MED ORDER — EMPAGLIFLOZIN 25 MG PO TABS: 25.0000 mg | ORAL_TABLET | Freq: Every day | ORAL | 11 refills | Status: DC

## 2021-05-28 NOTE — Telephone Encounter (Signed)
Pt called and wanted to talk about his concern of wanting labs done before visit.  I explained, next time he had appt, to ask to come in 2 days before visit for fasting labs.  Pt concerned about Jardiance, he has coupon for 30 tablets each time.  So I called [facility] t/w [place] and changed to 30 tab with 11 refills.

## 2021-06-01 ENCOUNTER — Telehealth: Payer: Self-pay | Admitting: Medical

## 2021-06-01 NOTE — Telephone Encounter (Signed)
Pt called and states that he was looking at the  medication gemibrozil and in the information he found it says not to take with a statin. He wants to make sure its ok. Please advise at [phone].

## 2021-06-02 LAB — PROTEIN ELECTROPHORESIS
A/G Ratio: 1 (ref 0.7–1.7)
Albumin ELP: 3.8 g/dL (ref 2.9–4.4)
Alpha 1: 0.3 g/dL (ref 0.0–0.4)
Alpha 2: 1.3 g/dL — ABNORMAL HIGH (ref 0.4–1.0)
Beta: 1.5 g/dL — ABNORMAL HIGH (ref 0.7–1.3)
Gamma Globulin: 0.7 g/dL (ref 0.4–1.8)
Globulin, Total: 3.8 g/dL (ref 2.2–3.9)
Total Protein: 7.6 g/dL (ref 6.0–8.5)

## 2021-06-02 LAB — SPECIMEN STATUS REPORT

## 2021-06-05 ENCOUNTER — Telehealth: Payer: Self-pay | Admitting: Family Medicine

## 2021-06-05 NOTE — Telephone Encounter (Signed)
Pt called and concerned with being told wbc has disease in it.  Talked with Daniel Lucas she had told pt he did not have Myleoma and he wanted to know what that was and she said white blood cells had a disease.  He took it that he had that and she said she told him he did not have that.    I explained that was good that he did not have.  His sister is dying with cancer and he was worrying.

## 2021-06-19 ENCOUNTER — Telehealth: Payer: Self-pay | Admitting: Family Medicine

## 2021-06-19 NOTE — Telephone Encounter (Signed)
Pt called he had to stop taking the Gemfibrozile, it was making his stomach HURT and making him dizzy.  He stopped it for a couple of days, the symptoms went away, he started back and symptoms came back.  Pt started back on the Lisinopril, he wants to stay on that and needs refills and needs dosing instructions.  Pt pf [phone]

## 2021-06-21 ENCOUNTER — Encounter: Payer: Self-pay | Admitting: Medical

## 2021-06-21 NOTE — Telephone Encounter (Signed)
Reviewed

## 2021-06-21 NOTE — Telephone Encounter (Signed)
Pt doesn't understand why his meds are always changing. I tried to explain to him about his side effects that they don't always effect everyone and bp med was changed as it wasn't keeping his bp down. He states he feels like a guniea pig and he said you can send this medication to [facility] # 90 with 1 refill for triglycerides but if he gets any sort of side effects he will stop it and hes through. He said his visit here last time was horrible. Just FYI

## 2021-06-26 ENCOUNTER — Other Ambulatory Visit: Payer: Self-pay | Admitting: Medical

## 2021-06-26 MED ORDER — FENOFIBRATE 145 MG PO TABS: 145.0000 mg | ORAL_TABLET | Freq: Every day | ORAL | 0 refills | Status: DC

## 2021-06-26 MED ORDER — NIACIN ER (ANTIHYPERLIPIDEMIC) 500 MG PO TBCR: 500.0000 mg | EXTENDED_RELEASE_TABLET | Freq: Every day | ORAL | 2 refills | Status: DC

## 2021-06-26 NOTE — Telephone Encounter (Signed)
Pt called and states he never got his fenobriate sent in #90. Please send in or let me know dose and I can send in

## 2021-06-26 NOTE — Telephone Encounter (Signed)
Pt was aware

## 2021-06-26 NOTE — Telephone Encounter (Addendum)
Pt called and left message on diana VM that insurance is not covering fenofibrate and that another medication needs to be sent in. [facility] NOT [facility]

## 2021-06-26 NOTE — Telephone Encounter (Signed)
Pt was notified.  

## 2021-07-04 ENCOUNTER — Telehealth: Payer: Self-pay

## 2021-07-04 MED ORDER — GLUCOSE BLOOD VI STRP: ORAL_STRIP | 1 refills | Status: DC

## 2021-07-04 NOTE — Telephone Encounter (Signed)
Pt. Called stating that he was supposed to get test strips called in from last visit and it never was. He wanted to know if you could call in accu chek aviva test strips to [facility] on [address]. He alos stated that his niacin was supposed to be sent in for a 90 day supply and he only got 30 on 06/26/21. I explained to him that it was sent in for a quantity of 30 with 2 refills on 06/26/21 and that should last him 90 days.

## 2021-07-04 NOTE — Telephone Encounter (Signed)
I have sent in Oxly test strips to pharmacy. And per shane only refilled #30 to pharmacy as he is dismissed and didn't want him to waste it if he decided not to take the medication

## 2021-09-02 ENCOUNTER — Other Ambulatory Visit: Payer: Self-pay | Admitting: Medical

## 2021-09-19 DIAGNOSIS — F410 Panic disorder [episodic paroxysmal anxiety] without agoraphobia: Secondary | ICD-10-CM | POA: Diagnosis not present

## 2022-01-15 ENCOUNTER — Ambulatory Visit: Payer: BC Managed Care – PPO | Admitting: Physician Assistant

## 2022-01-15 ENCOUNTER — Encounter: Payer: Self-pay | Admitting: Physician Assistant

## 2022-01-15 VITALS — BP 140/72 | HR 82 | Ht 74.0 in | Wt 201.4 lb

## 2022-01-15 DIAGNOSIS — I10 Essential (primary) hypertension: Secondary | ICD-10-CM | POA: Diagnosis not present

## 2022-01-15 DIAGNOSIS — I2510 Atherosclerotic heart disease of native coronary artery without angina pectoris: Secondary | ICD-10-CM

## 2022-01-15 DIAGNOSIS — E118 Type 2 diabetes mellitus with unspecified complications: Secondary | ICD-10-CM | POA: Diagnosis not present

## 2022-01-15 DIAGNOSIS — E785 Hyperlipidemia, unspecified: Secondary | ICD-10-CM

## 2022-01-15 DIAGNOSIS — N529 Male erectile dysfunction, unspecified: Secondary | ICD-10-CM | POA: Insufficient documentation

## 2022-01-15 MED ORDER — SILDENAFIL CITRATE 50 MG PO TABS: 50.0000 mg | ORAL_TABLET | Freq: Every day | ORAL | 1 refills | Status: DC | PRN

## 2022-01-15 NOTE — Assessment & Plan Note (Signed)
LDL in Sept 78.  His Trigs were elevated as well.  He could not tolerate gemfibrozil and could not afford fenofibrate.  He did take Vascepa years ago but it is not clear why this was stopped.  He does not recall having an intolerance.  He has changed his diet quite a bit since his labs in Sept.  I will arrange fasting CMET, Lipids.  If LDL > 70, increase Rosuvastatin vs adding Ezetimibe.  Consider Vascepa again if Trigs remain high.

## 2022-01-15 NOTE — Assessment & Plan Note (Signed)
Borderline control.  His daughter is a Marine scientist and she checks his BPs at home.  His BPs are typically around 120/70s.  Continue Carvedilol 6.25 mg twice daily, Valsartan/HCTZ 160.12.5 mg once daily.

## 2022-01-15 NOTE — Assessment & Plan Note (Signed)
S/p inferior STEMI in 2014 tx with BMS x 2 to the LCx.  He had mod non-obstructive disease in the mid RCA and D1 that has been managed medically.  He has not had anginal symptoms.  Continue ASA 81 mg once daily, Carvedilol 6.25 mg twice daily, Rosuvastatin 20 mg once daily.  F/u 1 year.

## 2022-01-15 NOTE — Assessment & Plan Note (Signed)
He does not plan to see his PCP anymore.  He is going to establish with an endocrinologist.  I have encouraged him to establish with someone as we cannot manage his diabetes.  He understands this.

## 2022-01-15 NOTE — Progress Notes (Signed)
Cardiology Office Note:    Date:  01/15/2022   ID:  KELLIS MCADAM, DOB 1955-09-19, MRN [id number]  PCP:  Kathyrn Lass  [facility] Providers Cardiologist:  Sherren Mocha, MD Cardiology APP:  [facility]     Referring MD: No ref. provider found   Chief Complaint:  F/u for CAD    Patient Profile: Coronary artery disease  S/p inf STEMI 10/2012 tx with BMS x 2 to LCx (Stentys Apposition Trial) Diabetes Mellitus Hypertension Hyperlipidemia  Former Tobacco use History of polycythemia  Prior CV Studies: Echo 10/30/12 Poss inf-basal HK, EF 55   Cardiac Catheterization 10/29/12 LAD mid to dist intramyocardial bridging, D1 40 LCx dist 100 RCA (dominant, small) mid 70-75 EF 45-50 PCI:  BMS x 2 to LCx  History of Present Illness:   Daniel Lucas is a 66 y.o. male with the above problem list.  He was last seen via telemedicine in June 2020.  He returns for follow-up.  He is here alone.  He would like his cardiac meds filled at our office.  He has not had chest pain, shortness of breath, syncope, orthopnea, leg edema.  BPs at home are 117/70s.  His Trigs were elevated when he last saw primary care. He was Rx'd Gemfibrozil but could not tolerate it.  Fenofibrate was too expensive.  He has changed his diet since his labs were drawn in Sept 2022.         Past Medical History:  Diagnosis Date   Anxiety    CAD (coronary artery disease)    a. 10/2012: inferolateral wall ST elevation MI s/p stent to distal LCx 10/29/12 (Stentys Apposition Trial randomizing him to a self-expanding bare-metal stent versus a traditional balloon expandable bare-metal coronary stent).    Diabetes mellitus ([place])    Former smoker    Hyperlipidemia    Hypertension    Leukocytosis    Polycythemia    a. patient reports hx of therapeutic phlebotomy in the 1970s => HCT eventually stabilized and he no longer required phlebotomy   Current Medications: Current Meds  Medication Sig   ALPRAZolam (XANAX) 1 MG  tablet Take 1 mg by mouth 4 (four) times daily as needed.   aspirin EC 81 MG tablet Take 1 tablet (81 mg total) by mouth daily.   carvedilol (COREG) 6.25 MG tablet Take 1 tablet (6.25 mg total) by mouth 2 (two) times daily with a meal.   empagliflozin (JARDIANCE) 25 MG TABS tablet Take 1 tablet (25 mg total) by mouth daily before breakfast.   glucose blood test strip Patient has accu-chek aviva meter. Please give test strips for this meter to test 1-2 times daily   metFORMIN (GLUCOPHAGE) 500 MG tablet Take 1 tablet (500 mg total) by mouth 3 (three) times daily.   niacin (NIASPAN) 500 MG CR tablet Take 1 tablet (500 mg total) by mouth at bedtime.   nitroGLYCERIN (NITROSTAT) 0.4 MG SL tablet Place 1 tablet (0.4 mg total) under the tongue every 5 (five) minutes as needed for chest pain.   rosuvastatin (CRESTOR) 20 MG tablet Take 1 tablet (20 mg total) by mouth daily.   valsartan-hydrochlorothiazide (DIOVAN-HCT) 160-12.5 MG tablet Take 1 tablet by mouth daily.   [DISCONTINUED] sildenafil (VIAGRA) 50 MG tablet Take 1 tablet (50 mg total) by mouth daily as needed for erectile dysfunction.    Allergies:   Patient has no known allergies.   Social History   Tobacco Use   Smoking status: Former  Packs/day: 1.00    Years: 30.00    Pack years: 30.00    Types: Cigarettes    Quit date: 06/16/2012    Years since quitting: 9.5   Smokeless tobacco: Never  Vaping Use   Vaping Use: Never used  Substance Use Topics   Alcohol use: No   Drug use: No    Family Hx: The patient's family history includes CAD in his brother, father, and mother; Diabetes Mellitus II in his brother and sister; Stroke in his mother. He was adopted.  ROS - See HPI  EKGs/Labs/Other Test Reviewed:    EKG:  EKG is   ordered today.  The ekg ordered today demonstrates normal sinus rhythm, HR 82, normal axis, septal Qs, non-specific ST-TW changes, QTc 432 ms, similar to old tracings  Recent Labs: 05/23/2021: ALT 17; BUN 14;  Creatinine, Ser 1.01; Hemoglobin 16.6; Platelets 297; Potassium 5.3; Sodium 140   Recent Lipid Panel Recent Labs    05/23/21 1100  CHOL 162  TRIG 304*  HDL 35*  LDLCALC 78     Risk Assessment/Calculations:         Physical Exam:    VS:  BP 140/72   Pulse 82   Ht _0  (1.88 m)   Wt 201 lb 6.4 oz (91.4 kg)   SpO2 98%   BMI 25.86 kg/m     Wt Readings from Last 3 Encounters:  01/15/22 201 lb 6.4 oz (91.4 kg)  05/23/21 204 lb 3.2 oz (92.6 kg)  04/26/20 212 lb (96.2 kg)    Physical Exam       ASSESSMENT & PLAN:   CAD (coronary artery disease) S/p inferior STEMI in 2014 tx with BMS x 2 to the LCx.  He had mod non-obstructive disease in the mid RCA and D1 that has been managed medically.  He has not had anginal symptoms.  Continue ASA 81 mg once daily, Carvedilol 6.25 mg twice daily, Rosuvastatin 20 mg once daily.  F/u 1 year.   Essential hypertension Borderline control.  His daughter is a Marine scientist and she checks his BPs at home.  His BPs are typically around 120/70s.  Continue Carvedilol 6.25 mg twice daily, Valsartan/HCTZ 160.12.5 mg once daily.    Hyperlipidemia LDL goal <70 LDL in Sept 78.  His Trigs were elevated as well.  He could not tolerate gemfibrozil and could not afford fenofibrate.  He did take Vascepa years ago but it is not clear why this was stopped.  He does not recall having an intolerance.  He has changed his diet quite a bit since his labs in Sept.  I will arrange fasting CMET, Lipids.  If LDL > 70, increase Rosuvastatin vs adding Ezetimibe.  Consider Vascepa again if Trigs remain high.    Diabetes mellitus with complication in adult patient [facility]) He does not plan to see his PCP anymore.  He is going to establish with an endocrinologist.  I have encouraged him to establish with someone as we cannot manage his diabetes.  He understands this.    Erectile dysfunction Refill sildenafil.          Dispo:  Return in about 1 year (around 01/16/2023) for Routine  Follow Up, w/ Dr. Burt Knack, or Richardson Dopp, PA-C.   Medication Adjustments/Labs and Tests Ordered: Current medicines are reviewed at length with the patient today.  Concerns regarding medicines are outlined above.  Tests Ordered: Orders Placed This Encounter  Procedures   Comp Met (CMET)   Lipid panel  EKG 12-Lead   Medication Changes: Meds ordered this encounter  Medications   sildenafil (VIAGRA) 50 MG tablet    Sig: Take 1 tablet (50 mg total) by mouth daily as needed for erectile dysfunction.    Dispense:  12 tablet    Refill:  1   Signed, Richardson Dopp, PA-C  01/15/2022 3:20 PM    [facility] [place], [place], [place]  [address] Phone: [phone]; Fax: [phone]

## 2022-01-15 NOTE — Patient Instructions (Signed)
Medication Instructions:  Your physician recommends that you continue on your current medications as directed. Please refer to the Current Medication list given to you today.  *If you need a refill on your cardiac medications before your next appointment, please call your pharmacy*   Lab Work: 3-4 WEEKS:  FASTING LIPID & CMET  If you have labs (blood work) drawn today and your tests are completely normal, you will receive your results only by: [place] (if you have MyChart) OR A paper copy in the mail If you have any lab test that is abnormal or we need to change your treatment, we will call you to review the results.   Testing/Procedures: None ordered   Follow-Up: At [facility], you and your health needs are our priority.  As part of our continuing mission to provide you with exceptional heart care, we have created designated Provider Care Teams.  These Care Teams include your primary Cardiologist (physician) and Advanced Practice Providers (APPs -  Physician Assistants and Nurse Practitioners) who all work together to provide you with the care you need, when you need it.  We recommend signing up for the patient portal called "MyChart".  Sign up information is provided on this After Visit Summary.  MyChart is used to connect with patients for Virtual Visits (Telemedicine).  Patients are able to view lab/test results, encounter notes, upcoming appointments, etc.  Non-urgent messages can be sent to your provider as well.   To learn more about what you can do with MyChart, go to [email or web address].    Your next appointment:   1 year(s)  The format for your next appointment:   In Person  Provider:   Sherren Mocha, MD  or Richardson Dopp, PA-C         Other Instructions   Important Information About Sugar

## 2022-01-15 NOTE — Assessment & Plan Note (Signed)
Refill sildenafil. 

## 2022-02-12 ENCOUNTER — Other Ambulatory Visit: Payer: BC Managed Care – PPO

## 2022-02-12 DIAGNOSIS — I2510 Atherosclerotic heart disease of native coronary artery without angina pectoris: Secondary | ICD-10-CM

## 2022-02-12 DIAGNOSIS — E785 Hyperlipidemia, unspecified: Secondary | ICD-10-CM | POA: Diagnosis not present

## 2022-02-12 DIAGNOSIS — I10 Essential (primary) hypertension: Secondary | ICD-10-CM

## 2022-02-13 ENCOUNTER — Telehealth: Payer: Self-pay | Admitting: *Deleted

## 2022-02-13 ENCOUNTER — Other Ambulatory Visit: Payer: Self-pay

## 2022-02-13 DIAGNOSIS — Z79899 Other long term (current) drug therapy: Secondary | ICD-10-CM

## 2022-02-13 LAB — LIPID PANEL
Chol/HDL Ratio: 5 ratio (ref 0.0–5.0)
Cholesterol, Total: 155 mg/dL (ref 100–199)
HDL: 31 mg/dL — ABNORMAL LOW (ref >39)
LDL Chol Calc (NIH): 64 mg/dL (ref 0–99)
Triglycerides: 387 mg/dL — ABNORMAL HIGH (ref 0–149)
VLDL Cholesterol Cal: 60 mg/dL — ABNORMAL HIGH (ref 5–40)

## 2022-02-13 LAB — COMPREHENSIVE METABOLIC PANEL
ALT: 19 IU/L (ref 0–44)
AST: 19 IU/L (ref 0–40)
Albumin/Globulin Ratio: 2.2 (ref 1.2–2.2)
Albumin: 5.3 g/dL — ABNORMAL HIGH (ref 3.8–4.8)
Alkaline Phosphatase: 45 IU/L (ref 44–121)
BUN/Creatinine Ratio: 15 (ref 10–24)
BUN: 15 mg/dL (ref 8–27)
Bilirubin Total: 0.6 mg/dL (ref 0.0–1.2)
CO2: 21 mmol/L (ref 20–29)
Calcium: 10.1 mg/dL (ref 8.6–10.2)
Chloride: 99 mmol/L (ref 96–106)
Creatinine, Ser: 0.99 mg/dL (ref 0.76–1.27)
Globulin, Total: 2.4 g/dL (ref 1.5–4.5)
Glucose: 171 mg/dL — ABNORMAL HIGH (ref 70–99)
Potassium: 4.3 mmol/L (ref 3.5–5.2)
Sodium: 140 mmol/L (ref 134–144)
Total Protein: 7.7 g/dL (ref 6.0–8.5)
eGFR: 83 mL/min/{1.73_m2} (ref >59)

## 2022-02-13 MED ORDER — ICOSAPENT ETHYL 1 G PO CAPS: 2.0000 g | ORAL_CAPSULE | Freq: Two times a day (BID) | ORAL | 11 refills | Status: DC

## 2022-02-13 NOTE — Telephone Encounter (Signed)
-----   Message from Liliane Shi, [place] sent at 02/13/2022  3:01 PM EDT ----- Glucose high.  Albumin high.  Creatinine, K+, LFTs normal.  Triglycerides too high.  LDL at goal.  He took Vascepa in the past which would be a good choice to lower Triglycerides.  There is no proven benefit to taking Niacin.   PLAN:  -He can stop the Niacin -Continue all other meds -Add Vascepa 2000 mg twice daily  -Lipids, LFTs in 3 mos -F/u with PCP for elevated Albumin Richardson Dopp, PA-C    02/13/2022 2:56 PM

## 2022-02-14 ENCOUNTER — Telehealth: Payer: Self-pay | Admitting: Cardiovascular Disease

## 2022-02-14 NOTE — Telephone Encounter (Signed)
Pt aware of recommendations and will cont.both meds ./cy

## 2022-02-14 NOTE — Telephone Encounter (Signed)
Pt c/o medication issue:  1. Name of Medication:   empagliflozin (JARDIANCE) 25 MG TABS tablet    icosapent Ethyl (VASCEPA) 1 g capsule   2. How are you currently taking this medication (dosage and times per day)?   3. Are you having a reaction (difficulty breathing--STAT)? No  4. What is your medication issue? Pt would like to make sure that is okay for him to take medications together. Please advise

## 2022-02-14 NOTE — Telephone Encounter (Signed)
Yes these are fine to take together

## 2022-03-12 DIAGNOSIS — F410 Panic disorder [episodic paroxysmal anxiety] without agoraphobia: Secondary | ICD-10-CM | POA: Diagnosis not present

## 2022-05-17 ENCOUNTER — Ambulatory Visit: Payer: BC Managed Care – PPO | Attending: Physician Assistant

## 2022-05-17 DIAGNOSIS — Z79899 Other long term (current) drug therapy: Secondary | ICD-10-CM

## 2022-05-17 LAB — LIPID PANEL
Chol/HDL Ratio: 4.4 ratio (ref 0.0–5.0)
Cholesterol, Total: 123 mg/dL (ref 100–199)
HDL: 28 mg/dL — ABNORMAL LOW (ref >39)
LDL Chol Calc (NIH): 58 mg/dL (ref 0–99)
Triglycerides: 228 mg/dL — ABNORMAL HIGH (ref 0–149)
VLDL Cholesterol Cal: 37 mg/dL (ref 5–40)

## 2022-05-17 LAB — HEPATIC FUNCTION PANEL
ALT: 14 IU/L (ref 0–44)
AST: 16 IU/L (ref 0–40)
Albumin: 5.1 g/dL — ABNORMAL HIGH (ref 3.9–4.9)
Alkaline Phosphatase: 42 IU/L — ABNORMAL LOW (ref 44–121)
Bilirubin Total: 0.5 mg/dL (ref 0.0–1.2)
Bilirubin, Direct: 0.14 mg/dL (ref 0.00–0.40)
Total Protein: 7.3 g/dL (ref 6.0–8.5)

## 2022-05-20 ENCOUNTER — Other Ambulatory Visit: Payer: Self-pay | Admitting: *Deleted

## 2022-05-20 DIAGNOSIS — I2510 Atherosclerotic heart disease of native coronary artery without angina pectoris: Secondary | ICD-10-CM

## 2022-05-20 MED ORDER — VALSARTAN-HYDROCHLOROTHIAZIDE 160-12.5 MG PO TABS: 1.0000 | ORAL_TABLET | Freq: Every day | ORAL | 3 refills | Status: DC

## 2022-05-20 MED ORDER — ROSUVASTATIN CALCIUM 20 MG PO TABS: 20.0000 mg | ORAL_TABLET | Freq: Every day | ORAL | 3 refills | Status: DC

## 2022-05-20 MED ORDER — EMPAGLIFLOZIN 25 MG PO TABS: 25.0000 mg | ORAL_TABLET | Freq: Every day | ORAL | 3 refills | Status: DC

## 2022-05-20 MED ORDER — CARVEDILOL 6.25 MG PO TABS: 6.2500 mg | ORAL_TABLET | Freq: Two times a day (BID) | ORAL | 3 refills | Status: DC

## 2022-05-20 NOTE — Progress Notes (Signed)
Pt has been made aware of normal result and verbalized understanding.  jw

## 2022-07-08 ENCOUNTER — Telehealth: Payer: Self-pay

## 2022-07-08 NOTE — Telephone Encounter (Signed)
Pt called asking if Dr. Glori Bickers would take him on as a new pt? Pt's sister, Ikeem Cleckler is currently Dr. Marliss Coots pt. Call back # [phone]

## 2022-07-08 NOTE — Telephone Encounter (Signed)
Not taking new patients at the moment

## 2022-08-05 ENCOUNTER — Encounter: Payer: Self-pay | Admitting: Internal Medicine

## 2022-08-05 ENCOUNTER — Ambulatory Visit: Payer: BC Managed Care – PPO | Admitting: Internal Medicine

## 2022-08-05 VITALS — BP 132/86 | HR 80 | Temp 97.4°F | Ht 73.0 in | Wt 200.0 lb

## 2022-08-05 DIAGNOSIS — I2510 Atherosclerotic heart disease of native coronary artery without angina pectoris: Secondary | ICD-10-CM

## 2022-08-05 DIAGNOSIS — I10 Essential (primary) hypertension: Secondary | ICD-10-CM | POA: Diagnosis not present

## 2022-08-05 DIAGNOSIS — E118 Type 2 diabetes mellitus with unspecified complications: Secondary | ICD-10-CM | POA: Diagnosis not present

## 2022-08-05 DIAGNOSIS — E0959 Drug or chemical induced diabetes mellitus with other circulatory complications: Secondary | ICD-10-CM

## 2022-08-05 DIAGNOSIS — Z0000 Encounter for general adult medical examination without abnormal findings: Secondary | ICD-10-CM | POA: Diagnosis not present

## 2022-08-05 LAB — POCT GLYCOSYLATED HEMOGLOBIN (HGB A1C): Hemoglobin A1C: 8.3 % — AB (ref 4.0–5.6)

## 2022-08-05 LAB — HM DIABETES FOOT EXAM

## 2022-08-05 MED ORDER — METFORMIN HCL 500 MG PO TABS: 500.0000 mg | ORAL_TABLET | Freq: Three times a day (TID) | ORAL | 3 refills | Status: DC

## 2022-08-05 NOTE — Assessment & Plan Note (Signed)
Past stents No recent symptoms On carvedilol 6.25 bid, rosuvastatin 20 daily, ASA 81, valsartan '160mg'$  (with HCTZ)
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
## 2022-08-05 NOTE — Assessment & Plan Note (Addendum)
Hopefully still good control on jardiance 25 and metformin 500 tid  A1c was 8.3% today Out of metformin for 2+ weeks though Will restart this and recheck him in 70month

## 2022-08-05 NOTE — Assessment & Plan Note (Signed)
BP Readings from Last 3 Encounters:  08/05/22 132/86  01/15/22 140/72  05/23/21 136/82   Controlled on heart meds plus the HCTZ 12.5

## 2022-08-05 NOTE — Assessment & Plan Note (Signed)
Exercises Recommended updated COVID vaccine Td next year Cologuard or other test next year PSA with next blood draw

## 2022-08-05 NOTE — Addendum Note (Signed)
Addended by: Pilar Grammes on: 08/05/2022 11:11 AM   Modules accepted: Orders

## 2022-08-07 ENCOUNTER — Telehealth: Payer: Self-pay | Admitting: Internal Medicine

## 2022-08-07 DIAGNOSIS — E118 Type 2 diabetes mellitus with unspecified complications: Secondary | ICD-10-CM

## 2022-08-07 MED ORDER — ACCU-CHEK GUIDE W/DEVICE KIT: PACK | 0 refills | Status: AC

## 2022-08-07 MED ORDER — ACCU-CHEK GUIDE VI STRP: ORAL_STRIP | 3 refills | Status: AC

## 2022-08-07 NOTE — Telephone Encounter (Signed)
Patient called in and stated that his insurance should cover Accu-check monitor and strips. He will need a prescription to be sent over. It be sent over to  [facility] ([facility] [place] [facility] [place]   . Please advise. Thank you!

## 2022-08-07 NOTE — Telephone Encounter (Signed)
Rx for Accu-Chek Guide Meter Kit and Test Strips sent to [facility] on [address].

## 2022-11-04 ENCOUNTER — Ambulatory Visit: Payer: BC Managed Care – PPO | Admitting: Internal Medicine

## 2022-11-04 ENCOUNTER — Encounter: Payer: Self-pay | Admitting: Internal Medicine

## 2022-11-04 VITALS — BP 124/84 | HR 72 | Temp 97.9°F | Ht 73.0 in | Wt 202.0 lb

## 2022-11-04 DIAGNOSIS — R55 Syncope and collapse: Secondary | ICD-10-CM

## 2022-11-04 DIAGNOSIS — E1159 Type 2 diabetes mellitus with other circulatory complications: Secondary | ICD-10-CM | POA: Diagnosis not present

## 2022-11-04 DIAGNOSIS — I10 Essential (primary) hypertension: Secondary | ICD-10-CM

## 2022-11-04 DIAGNOSIS — E118 Type 2 diabetes mellitus with unspecified complications: Secondary | ICD-10-CM | POA: Diagnosis not present

## 2022-11-04 DIAGNOSIS — I2510 Atherosclerotic heart disease of native coronary artery without angina pectoris: Secondary | ICD-10-CM

## 2022-11-04 LAB — POCT GLYCOSYLATED HEMOGLOBIN (HGB A1C): Hemoglobin A1C: 8.7 % — AB (ref 4.0–5.6)

## 2022-11-04 MED ORDER — GLIPIZIDE 5 MG PO TABS: 5.0000 mg | ORAL_TABLET | Freq: Every day | ORAL | 3 refills | Status: DC
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
## 2022-11-04 NOTE — Assessment & Plan Note (Signed)
BP Readings from Last 3 Encounters:  11/04/22 124/84  08/05/22 132/86  01/15/22 140/72   Good control Carvedilol 6.25 bid, valsartan/HCTZ 160/12.5

## 2022-11-04 NOTE — Assessment & Plan Note (Signed)
No angina--with ASA and statin

## 2022-11-04 NOTE — Assessment & Plan Note (Signed)
Lab Results  Component Value Date   HGBA1C 8.7 (A) 11/04/2022   Actually worse Metformin 500 tid, jardiance 25 Will add glipizide '5mg'$  daily Pretty good with diet---discussed increased exercise

## 2022-11-04 NOTE — Assessment & Plan Note (Signed)
Rare episodes Classic spell Discussed getting supine with the early symptoms

## 2023-01-17 ENCOUNTER — Ambulatory Visit: Payer: BC Managed Care – PPO | Admitting: Cardiovascular Disease

## 2023-02-04 ENCOUNTER — Ambulatory Visit: Payer: BC Managed Care – PPO | Admitting: Internal Medicine

## 2023-02-04 ENCOUNTER — Encounter: Payer: Self-pay | Admitting: Internal Medicine

## 2023-02-04 VITALS — BP 134/84 | HR 79 | Temp 97.5°F | Ht 73.0 in | Wt 198.0 lb

## 2023-02-04 DIAGNOSIS — E1159 Type 2 diabetes mellitus with other circulatory complications: Secondary | ICD-10-CM

## 2023-02-04 DIAGNOSIS — Z7984 Long term (current) use of oral hypoglycemic drugs: Secondary | ICD-10-CM | POA: Diagnosis not present

## 2023-02-04 DIAGNOSIS — I2510 Atherosclerotic heart disease of native coronary artery without angina pectoris: Secondary | ICD-10-CM

## 2023-02-04 LAB — POCT GLYCOSYLATED HEMOGLOBIN (HGB A1C): Hemoglobin A1C: 7.1 % — AB (ref 4.0–5.6)
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
## 2023-02-04 NOTE — Assessment & Plan Note (Signed)
No angina on carvedilol, jardiance, vascepa, valsartan, rosuvastatin Hasn't needed nitro Keeps up with cardiology

## 2023-02-04 NOTE — Assessment & Plan Note (Signed)
Lab Results  Component Value Date   HGBA1C 7.1 (A) 02/04/2023   Much better now Better lifestyle---and did well with adding glipizide 5mg  daily, metformin 500 tid and jardiance 25mg  daily

## 2023-02-26 ENCOUNTER — Other Ambulatory Visit: Payer: Self-pay | Admitting: Physician Assistant

## 2023-02-26 MED ORDER — ICOSAPENT ETHYL 1 G PO CAPS: 2.0000 g | ORAL_CAPSULE | Freq: Two times a day (BID) | ORAL | 1 refills | Status: DC

## 2023-03-12 ENCOUNTER — Telehealth: Payer: Self-pay | Admitting: Cardiovascular Disease

## 2023-03-12 DIAGNOSIS — F410 Panic disorder [episodic paroxysmal anxiety] without agoraphobia: Secondary | ICD-10-CM | POA: Diagnosis not present

## 2023-03-12 NOTE — Telephone Encounter (Signed)
Error

## 2023-04-01 NOTE — Progress Notes (Unsigned)
Cardiology Office Note:    Date:  04/02/2023  ID:  Daniel Lucas, DOB 27-Jan-1956, MRN [id number] PCP: Karie Schwalbe, MD  [facility] Providers Cardiologist:  Tonny Bollman, MD Cardiology APP:  Beatrice Lecher, PA-C       Patient Profile:      Coronary artery disease  S/p inf STEMI 10/2012 tx with BMS x 2 to LCx (Stentys Apposition Trial) LHC 10/29/2012: LAD mid to distal intramyocardial bridging, D1 40, mid RCA 70-75, distal LCx 100 (PCI) TTE 10/30/2012: Poss inf-basal HK, EF 55  Diabetes Mellitus Hypertension Hyperlipidemia  Former Tobacco use History of polycythemia            Discussed the use of AI scribe software for clinical note transcription with the patient, who gave verbal consent to proceed.  History of Present Illness   A 67 year old male with a history of coronary artery disease, status post inferior STEMI in 2014 treated with BMS x 2 to the LCX, presents for his annual follow-up. He is currently on carvedilol, vasepa, nitroglycerin, rosuvastatin, and valsartan/HCTZ. The patient reports a single episode of dizziness and fainting that occurred in March after bending down and standing up quickly while helping his son. He describes feeling dizzy, sweating, and eventually falling to the ground. His blood pressure was checked immediately after the episode and was found to be 62/40. He denies any chest discomfort, shortness of breath, or other symptoms during the episode. Since then, he has not experienced any similar episodes, even during strenuous activities. He also reports regular exercise, including walking a couple of miles on a treadmill and doing crunches at the gym. His labs from June 2023 show normal potassium, creatinine, ALT, and LDL levels, with slightly elevated triglycerides at 228.      ROS: See HPI No claudication      Studies Reviewed:   EKG Interpretation Date/Time:  Wednesday April 02 2023 07:56:34 EDT Ventricular Rate:  76 PR  Interval:  214 QRS Duration:  74 QT Interval:  372 QTC Calculation: 418 R Axis:   83  Text Interpretation: Sinus rhythm with 1st degree A-V block Normal axis Non-specific ST-TW changes No significant change since last tracing Confirmed by Tereso Newcomer [phone]) on 04/02/2023 8:01:47 AM    Risk Assessment/Calculations:             Physical Exam:   VS:  BP 127/63   Pulse 76   Ht 6\' 1"  (1.854 m)   Wt 203 lb 6.4 oz (92.3 kg)   SpO2 96%   BMI 26.84 kg/m    Wt Readings from Last 3 Encounters:  04/02/23 203 lb 6.4 oz (92.3 kg)  02/04/23 198 lb (89.8 kg)  11/04/22 202 lb (91.6 kg)    GEN: Well nourished, well developed in no acute distress NECK: No JVD CARDIAC: RRR, no murmurs, rubs, gallops RESPIRATORY:  Clear to auscultation without rales, wheezing or rhonchi  ABDOMEN: Soft EXTREMITIES:  No edema     Assessment and Plan:  CAD (coronary artery disease) Status post inferior STEMI in March 2014, treated with BMS x 2 to the LCX. Stable, no chest pain to suggest angina. -Continue Carvedilol 6.25mg  twice daily, Nitroglycerin as needed, Rosuvastatin 20mg  daily.  Essential hypertension Controlled. -Continue Carvedilol 6.25mg  twice daily, Valsartan/HCTZ 160/12.5mg  daily.  Hyperlipidemia LDL goal <70 Labs from 04/2022 reviewed. LDL was at goal (58), triglycerides were elevated (228). -Continue Rosuvastatin 20mg  daily, Vasepa 2g twice daily. -Check follow up CMP and lipids today.  Vasovagal syncope  Likely vasovagal episode in March 2024, no recurrence. No further workup needed at this time.  -Track blood pressure for a week and send readings for review. If blood pressure is running low, consider reducing HCTZ.  Diabetes mellitus with circulatory complication, without long-term current use of insulin (HCC) Managed by primary care now. We have been filling his Jardiance -Continue and refill Jardiance 25mg  daily.  Erectile dysfunction Continue and refill Sildenafil 50 mg once daily as  needed.        Dispo:  Return in about 1 year (around 04/01/2024) for Routine Follow Up, w/ Dr. Excell Seltzer, or Tereso Newcomer, PA-C.  Signed, Tereso Newcomer, PA-C

## 2023-04-02 ENCOUNTER — Encounter: Payer: Self-pay | Admitting: Physician Assistant

## 2023-04-02 ENCOUNTER — Ambulatory Visit: Payer: BC Managed Care – PPO | Attending: Cardiovascular Disease | Admitting: Physician Assistant

## 2023-04-02 VITALS — BP 127/63 | HR 76 | Ht 73.0 in | Wt 203.4 lb

## 2023-04-02 DIAGNOSIS — E1159 Type 2 diabetes mellitus with other circulatory complications: Secondary | ICD-10-CM

## 2023-04-02 DIAGNOSIS — I2510 Atherosclerotic heart disease of native coronary artery without angina pectoris: Secondary | ICD-10-CM | POA: Diagnosis not present

## 2023-04-02 DIAGNOSIS — E785 Hyperlipidemia, unspecified: Secondary | ICD-10-CM

## 2023-04-02 DIAGNOSIS — R55 Syncope and collapse: Secondary | ICD-10-CM | POA: Diagnosis not present

## 2023-04-02 DIAGNOSIS — I10 Essential (primary) hypertension: Secondary | ICD-10-CM

## 2023-04-02 DIAGNOSIS — N529 Male erectile dysfunction, unspecified: Secondary | ICD-10-CM

## 2023-04-02 MED ORDER — ROSUVASTATIN CALCIUM 20 MG PO TABS: 20.0000 mg | ORAL_TABLET | Freq: Every day | ORAL | 3 refills | Status: DC

## 2023-04-02 MED ORDER — CARVEDILOL 6.25 MG PO TABS: 6.2500 mg | ORAL_TABLET | Freq: Two times a day (BID) | ORAL | 3 refills | Status: DC

## 2023-04-02 MED ORDER — SILDENAFIL CITRATE 50 MG PO TABS: 50.0000 mg | ORAL_TABLET | Freq: Every day | ORAL | 3 refills | Status: DC | PRN

## 2023-04-02 MED ORDER — ICOSAPENT ETHYL 1 G PO CAPS: 2.0000 g | ORAL_CAPSULE | Freq: Two times a day (BID) | ORAL | 3 refills | Status: DC

## 2023-04-02 MED ORDER — VALSARTAN-HYDROCHLOROTHIAZIDE 160-12.5 MG PO TABS: 1.0000 | ORAL_TABLET | Freq: Every day | ORAL | 3 refills | Status: DC

## 2023-04-02 MED ORDER — EMPAGLIFLOZIN 25 MG PO TABS: 25.0000 mg | ORAL_TABLET | Freq: Every day | ORAL | 3 refills | Status: DC

## 2023-04-02 MED ORDER — NITROGLYCERIN 0.4 MG SL SUBL: 0.4000 mg | SUBLINGUAL_TABLET | SUBLINGUAL | 12 refills | Status: AC | PRN

## 2023-04-02 NOTE — Assessment & Plan Note (Signed)
Controlled. -Continue Carvedilol 6.25mg  twice daily, Valsartan/HCTZ 160/12.5mg  daily.

## 2023-04-02 NOTE — Patient Instructions (Signed)
Medication Instructions:    TODAY WE REFILLED YOUR VIAGRA 30 TABLETS WITH 3 REFILLS  PLEASE CONTACT CLINIC BACK WHEN  YOU ARE READY FOR REFILLS ON THE FOLLOWING:   COREG  JARDIANCE  NITROGLYCERIN  CRESTOR    *If you need a refill on your cardiac medications before your next appointment, please call your pharmacy*   Lab Work:  CMET AND LIPIDS   If you have labs (blood work) drawn today and your tests are completely normal, you will receive your results only by: MyChart Message (if you have MyChart) OR A paper copy in the mail If you have any lab test that is abnormal or we need to change your treatment, we will call you to review the results.   Testing/Procedures: NONE ORDERED  TODAY     Follow-Up: At [facility], you and your health needs are our priority.  As part of our continuing mission to provide you with exceptional heart care, we have created designated Provider Care Teams.  These Care Teams include your primary Cardiologist (physician) and Advanced Practice Providers (APPs -  Physician Assistants and Nurse Practitioners) who all work together to provide you with the care you need, when you need it.  We recommend signing up for the patient portal called "MyChart".  Sign up information is provided on this After Visit Summary.  MyChart is used to connect with patients for Virtual Visits (Telemedicine).  Patients are able to view lab/test results, encounter notes, upcoming appointments, etc.  Non-urgent messages can be sent to your provider as well.   To learn more about what you can do with MyChart, go to [email or web address].    Your next appointment:   1 year(s)  Provider:   Tonny Bollman, MD  or Tereso Newcomer, PA-C      Other Instructions  PLEASE TAKE YOUR BLOOD PRESSURE MIDDAY FOR ONE WEEK  AND CALL CLINIC WITH READINGS

## 2023-04-02 NOTE — Assessment & Plan Note (Signed)
Labs from 04/2022 reviewed. LDL was at goal (58), triglycerides were elevated (228). -Continue Rosuvastatin 20mg  daily, Vasepa 2g twice daily. -Check follow up CMP and lipids today.

## 2023-04-02 NOTE — Assessment & Plan Note (Signed)
Managed by primary care now. We have been filling his Jardiance -Continue and refill Jardiance 25mg  daily.

## 2023-04-02 NOTE — Assessment & Plan Note (Signed)
Likely vasovagal episode in March 2024, no recurrence. No further workup needed at this time.  -Track blood pressure for a week and send readings for review. If blood pressure is running low, consider reducing HCTZ.

## 2023-04-02 NOTE — Assessment & Plan Note (Signed)
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 Stable, no chest pain to suggest angina. -Continue Carvedilol 6.25mg  twice daily, Nitroglycerin as needed, Rosuvastatin 20mg  daily.

## 2023-04-02 NOTE — Assessment & Plan Note (Signed)
Continue and refill Sildenafil 50 mg once daily as needed.

## 2023-04-03 ENCOUNTER — Telehealth: Payer: Self-pay

## 2023-04-03 DIAGNOSIS — I2510 Atherosclerotic heart disease of native coronary artery without angina pectoris: Secondary | ICD-10-CM

## 2023-04-03 DIAGNOSIS — E785 Hyperlipidemia, unspecified: Secondary | ICD-10-CM

## 2023-04-03 LAB — COMPREHENSIVE METABOLIC PANEL WITH GFR
ALT: 19 IU/L (ref 0–44)
AST: 17 IU/L (ref 0–40)
Albumin: 5 g/dL — ABNORMAL HIGH (ref 3.9–4.9)
Alkaline Phosphatase: 44 IU/L (ref 44–121)
BUN/Creatinine Ratio: 13 (ref 10–24)
BUN: 16 mg/dL (ref 8–27)
Bilirubin Total: 0.5 mg/dL (ref 0.0–1.2)
CO2: 24 mmol/L (ref 20–29)
Calcium: 10.2 mg/dL (ref 8.6–10.2)
Chloride: 101 mmol/L (ref 96–106)
Creatinine, Ser: 1.26 mg/dL (ref 0.76–1.27)
Globulin, Total: 2.6 g/dL (ref 1.5–4.5)
Glucose: 174 mg/dL — ABNORMAL HIGH (ref 70–99)
Potassium: 4.2 mmol/L (ref 3.5–5.2)
Sodium: 143 mmol/L (ref 134–144)
Total Protein: 7.6 g/dL (ref 6.0–8.5)
eGFR: 62 mL/min/1.73

## 2023-04-03 LAB — LIPID PANEL
Chol/HDL Ratio: 4.2 ratio (ref 0.0–5.0)
Cholesterol, Total: 135 mg/dL (ref 100–199)
HDL: 32 mg/dL — ABNORMAL LOW
LDL Chol Calc (NIH): 57 mg/dL (ref 0–99)
Triglycerides: 290 mg/dL — ABNORMAL HIGH (ref 0–149)
VLDL Cholesterol Cal: 46 mg/dL — ABNORMAL HIGH (ref 5–40)

## 2023-04-03 MED ORDER — ROSUVASTATIN CALCIUM 40 MG PO TABS: 40.0000 mg | ORAL_TABLET | Freq: Every day | ORAL | 3 refills | Status: DC

## 2023-04-03 NOTE — Telephone Encounter (Signed)
Spoke with patient, no further questions at this time. Just wanted to thank [place] for taking time to help him understand his care.

## 2023-04-03 NOTE — Telephone Encounter (Signed)
-----   Message from [place] sent at 04/03/2023  8:43 AM EDT ----- LDL minimally above goal of < 55. Triglycerides still elevated. Glucose elevated. Creatinine slightly increased but normal. K+, LFTs normal.  PLAN:  -Increase Crestor to 40 mg once daily  -CMET, Lipids 3 mos Tereso Newcomer, PA-C    04/03/2023 8:12 AM

## 2023-04-03 NOTE — Telephone Encounter (Signed)
Spoke with patient about recent lab work and [employer] recommendations. Patient agrees to increase crestor to 40 mg daily and repeat lab work (CMET and Lipids) on 07/09/23. No further needs at this time

## 2023-04-03 NOTE — Telephone Encounter (Signed)
Pt is requesting a callback from [place] regarding his results. He stated he has one more question he forgot to ask. Please advise.

## 2023-07-09 ENCOUNTER — Other Ambulatory Visit: Payer: Self-pay | Admitting: Physician Assistant

## 2023-07-09 ENCOUNTER — Ambulatory Visit: Payer: BC Managed Care – PPO | Attending: Physician Assistant

## 2023-07-09 DIAGNOSIS — E785 Hyperlipidemia, unspecified: Secondary | ICD-10-CM | POA: Diagnosis not present

## 2023-07-09 DIAGNOSIS — I2510 Atherosclerotic heart disease of native coronary artery without angina pectoris: Secondary | ICD-10-CM

## 2023-07-10 LAB — COMPREHENSIVE METABOLIC PANEL WITH GFR
ALT: 20 [IU]/L (ref 0–44)
AST: 18 [IU]/L (ref 0–40)
Albumin: 4.8 g/dL (ref 3.9–4.9)
Alkaline Phosphatase: 44 [IU]/L (ref 44–121)
BUN/Creatinine Ratio: 19 (ref 10–24)
BUN: 20 mg/dL (ref 8–27)
Bilirubin Total: 0.3 mg/dL (ref 0.0–1.2)
CO2: 18 mmol/L — ABNORMAL LOW (ref 20–29)
Calcium: 9.8 mg/dL (ref 8.6–10.2)
Chloride: 101 mmol/L (ref 96–106)
Creatinine, Ser: 1.03 mg/dL (ref 0.76–1.27)
Globulin, Total: 2.7 g/dL (ref 1.5–4.5)
Glucose: 140 mg/dL — ABNORMAL HIGH (ref 70–99)
Potassium: 4.2 mmol/L (ref 3.5–5.2)
Sodium: 142 mmol/L (ref 134–144)
Total Protein: 7.5 g/dL (ref 6.0–8.5)
eGFR: 79 mL/min/{1.73_m2}

## 2023-07-10 LAB — LIPID PANEL
Chol/HDL Ratio: 4.1 ratio (ref 0.0–5.0)
Cholesterol, Total: 122 mg/dL (ref 100–199)
HDL: 30 mg/dL — ABNORMAL LOW
LDL Chol Calc (NIH): 45 mg/dL (ref 0–99)
Triglycerides: 310 mg/dL — ABNORMAL HIGH (ref 0–149)
VLDL Cholesterol Cal: 47 mg/dL — ABNORMAL HIGH (ref 5–40)

## 2023-07-18 ENCOUNTER — Other Ambulatory Visit: Payer: Self-pay | Admitting: Internal Medicine

## 2023-08-11 ENCOUNTER — Encounter: Payer: Self-pay | Admitting: Internal Medicine

## 2023-08-11 ENCOUNTER — Ambulatory Visit (INDEPENDENT_AMBULATORY_CARE_PROVIDER_SITE_OTHER): Payer: BC Managed Care – PPO | Admitting: Internal Medicine

## 2023-08-11 VITALS — BP 130/84 | HR 88 | Temp 98.0°F | Ht 73.25 in | Wt 205.0 lb

## 2023-08-11 DIAGNOSIS — E119 Type 2 diabetes mellitus without complications: Secondary | ICD-10-CM | POA: Diagnosis not present

## 2023-08-11 DIAGNOSIS — E1159 Type 2 diabetes mellitus with other circulatory complications: Secondary | ICD-10-CM | POA: Diagnosis not present

## 2023-08-11 DIAGNOSIS — I2510 Atherosclerotic heart disease of native coronary artery without angina pectoris: Secondary | ICD-10-CM | POA: Diagnosis not present

## 2023-08-11 DIAGNOSIS — Z23 Encounter for immunization: Secondary | ICD-10-CM

## 2023-08-11 DIAGNOSIS — Z1211 Encounter for screening for malignant neoplasm of colon: Secondary | ICD-10-CM

## 2023-08-11 DIAGNOSIS — Z0000 Encounter for general adult medical examination without abnormal findings: Secondary | ICD-10-CM | POA: Diagnosis not present

## 2023-08-11 DIAGNOSIS — Z7984 Long term (current) use of oral hypoglycemic drugs: Secondary | ICD-10-CM

## 2023-08-11 LAB — POCT GLYCOSYLATED HEMOGLOBIN (HGB A1C): Hemoglobin A1C: 7.4 % — AB (ref 4.0–5.6)

## 2023-08-11 LAB — HM DIABETES FOOT EXAM

## 2023-08-11 NOTE — Assessment & Plan Note (Signed)
Hard to eat healthy [place] On glizide 5 daily, jardiance 25 and metformin 500 tid A1c up a bit --- 7.6% No medication changes are needed

## 2023-08-11 NOTE — Assessment & Plan Note (Signed)
No symptoms Rosuvastatin just increased to 40mg  Vascepa, carvedilol 6.25 bid, valsartan/hydrochlorothiazide 160/12.5 and the jardiance

## 2023-08-11 NOTE — Addendum Note (Signed)
Addended by: Eual Fines on: 08/11/2023 09:37 AM   Modules accepted: Orders

## 2023-08-11 NOTE — Assessment & Plan Note (Signed)
Healthy Discussed fitness--still working a lot Will order Cologuard Plan PSA with his next phlebotomy Flu vaccine today Prefers no COVID vaccine
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
## 2023-08-22 DIAGNOSIS — Z1211 Encounter for screening for malignant neoplasm of colon: Secondary | ICD-10-CM | POA: Diagnosis not present

## 2023-08-28 LAB — COLOGUARD: COLOGUARD: NEGATIVE

## 2023-09-12 DIAGNOSIS — F410 Panic disorder [episodic paroxysmal anxiety] without agoraphobia: Secondary | ICD-10-CM | POA: Diagnosis not present

## 2023-10-12 ENCOUNTER — Other Ambulatory Visit: Payer: Self-pay | Admitting: Internal Medicine

## 2023-10-14 ENCOUNTER — Other Ambulatory Visit: Payer: Self-pay | Admitting: Internal Medicine

## 2024-02-10 ENCOUNTER — Encounter: Payer: Self-pay | Admitting: Internal Medicine

## 2024-02-10 ENCOUNTER — Ambulatory Visit: Payer: Self-pay | Admitting: Internal Medicine

## 2024-02-10 ENCOUNTER — Ambulatory Visit: Payer: BC Managed Care – PPO | Admitting: Internal Medicine

## 2024-02-10 VITALS — BP 110/70 | HR 79 | Ht 73.25 in | Wt 202.0 lb

## 2024-02-10 DIAGNOSIS — E119 Type 2 diabetes mellitus without complications: Secondary | ICD-10-CM | POA: Diagnosis not present

## 2024-02-10 DIAGNOSIS — I2510 Atherosclerotic heart disease of native coronary artery without angina pectoris: Secondary | ICD-10-CM | POA: Diagnosis not present

## 2024-02-10 DIAGNOSIS — E1159 Type 2 diabetes mellitus with other circulatory complications: Secondary | ICD-10-CM | POA: Diagnosis not present

## 2024-02-10 DIAGNOSIS — I10 Essential (primary) hypertension: Secondary | ICD-10-CM | POA: Diagnosis not present

## 2024-02-10 DIAGNOSIS — Z7984 Long term (current) use of oral hypoglycemic drugs: Secondary | ICD-10-CM

## 2024-02-10 LAB — POCT GLYCOSYLATED HEMOGLOBIN (HGB A1C): Hemoglobin A1C: 7.8 % — AB (ref 4.0–5.6)

## 2024-02-10 NOTE — Assessment & Plan Note (Signed)
 A1c up some to 7.8%---likely due to difficulties with healthy eating when up at work site Discussed lifestyle On glipizide  5, jardiance  25 and metformin  500 tid

## 2024-02-10 NOTE — Assessment & Plan Note (Signed)
 BP Readings from Last 3 Encounters:  02/10/24 110/70  08/11/23 130/84  04/02/23 127/63   Some orthostatic symptoms at times If worsens, would stop the hydrochlorothiazide  and just continue the valsartan 
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
## 2024-02-10 NOTE — Addendum Note (Signed)
 Addended by: Franne Ivory on: 02/10/2024 08:45 AM   Modules accepted: Orders

## 2024-02-10 NOTE — Assessment & Plan Note (Addendum)
 No angina on jardiance , carvedilol  6.25 bid, rosuvastatin  40mg , valsartan  160

## 2024-03-22 DIAGNOSIS — F410 Panic disorder [episodic paroxysmal anxiety] without agoraphobia: Secondary | ICD-10-CM | POA: Diagnosis not present

## 2024-03-29 ENCOUNTER — Encounter: Payer: Self-pay | Admitting: Internal Medicine

## 2024-03-29 ENCOUNTER — Telehealth: Payer: Self-pay

## 2024-03-29 NOTE — Telephone Encounter (Signed)
 Copied from CRM #[id number]. Topic: Clinical - Medication Question >> Mar 29, 2024  3:24 PM Donna E wrote: Reason for CRM: patient wants to make sure he has enough medication to last until next appt with Dr Bennett,   Patient does not want to run out of medication before next appt   metFORMIN  (GLUCOPHAGE ) 500 MG tablet glipiZIDE  (GLUCOTROL ) 5 MG tablet   Patient stated the nurse said would call in refills for medications as he walking down the hall in the office.

## 2024-03-29 NOTE — Telephone Encounter (Signed)
 Left message on vm that he has plenty of refills at the pharmacy.

## 2024-04-07 ENCOUNTER — Other Ambulatory Visit: Payer: Self-pay | Admitting: Physician Assistant
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
## 2024-04-21 ENCOUNTER — Ambulatory Visit: Attending: Cardiology | Admitting: Emergency Medicine

## 2024-04-21 ENCOUNTER — Encounter: Payer: Self-pay | Admitting: Emergency Medicine

## 2024-04-21 VITALS — BP 124/82 | HR 85 | Ht 73.25 in | Wt 202.2 lb

## 2024-04-21 DIAGNOSIS — I2510 Atherosclerotic heart disease of native coronary artery without angina pectoris: Secondary | ICD-10-CM

## 2024-04-21 DIAGNOSIS — E119 Type 2 diabetes mellitus without complications: Secondary | ICD-10-CM | POA: Diagnosis not present

## 2024-04-21 DIAGNOSIS — E782 Mixed hyperlipidemia: Secondary | ICD-10-CM | POA: Diagnosis not present

## 2024-04-21 DIAGNOSIS — N529 Male erectile dysfunction, unspecified: Secondary | ICD-10-CM

## 2024-04-21 DIAGNOSIS — I10 Essential (primary) hypertension: Secondary | ICD-10-CM | POA: Diagnosis not present

## 2024-04-21 DIAGNOSIS — Z7984 Long term (current) use of oral hypoglycemic drugs: Secondary | ICD-10-CM

## 2024-04-21 LAB — COMPREHENSIVE METABOLIC PANEL WITH GFR
ALT: 16 IU/L (ref 0–44)
AST: 16 IU/L (ref 0–40)
Albumin: 5 g/dL — ABNORMAL HIGH (ref 3.9–4.9)
Alkaline Phosphatase: 49 IU/L (ref 44–121)
BUN/Creatinine Ratio: 19 (ref 10–24)
BUN: 24 mg/dL (ref 8–27)
Bilirubin Total: 0.4 mg/dL (ref 0.0–1.2)
CO2: 20 mmol/L (ref 20–29)
Calcium: 9.7 mg/dL (ref 8.6–10.2)
Chloride: 100 mmol/L (ref 96–106)
Creatinine, Ser: 1.24 mg/dL (ref 0.76–1.27)
Globulin, Total: 2.6 g/dL (ref 1.5–4.5)
Glucose: 159 mg/dL — ABNORMAL HIGH (ref 70–99)
Potassium: 4.4 mmol/L (ref 3.5–5.2)
Sodium: 139 mmol/L (ref 134–144)
Total Protein: 7.6 g/dL (ref 6.0–8.5)
eGFR: 63 mL/min/1.73 (ref >59)

## 2024-04-21 LAB — LIPID PANEL
Chol/HDL Ratio: 4.3 ratio (ref 0.0–5.0)
Cholesterol, Total: 142 mg/dL (ref 100–199)
HDL: 33 mg/dL — ABNORMAL LOW (ref >39)
LDL Chol Calc (NIH): 57 mg/dL (ref 0–99)
Triglycerides: 337 mg/dL — ABNORMAL HIGH (ref 0–149)
VLDL Cholesterol Cal: 52 mg/dL — ABNORMAL HIGH (ref 5–40)

## 2024-04-21 MED ORDER — VALSARTAN-HYDROCHLOROTHIAZIDE 160-12.5 MG PO TABS: 1.0000 | ORAL_TABLET | Freq: Every day | ORAL | 3 refills | Status: AC

## 2024-04-21 MED ORDER — CARVEDILOL 6.25 MG PO TABS: 6.2500 mg | ORAL_TABLET | Freq: Two times a day (BID) | ORAL | 3 refills | Status: AC

## 2024-04-21 MED ORDER — VASCEPA 1 G PO CAPS: 2.0000 g | ORAL_CAPSULE | Freq: Two times a day (BID) | ORAL | 3 refills | Status: AC

## 2024-04-21 MED ORDER — EMPAGLIFLOZIN 25 MG PO TABS: 25.0000 mg | ORAL_TABLET | Freq: Every day | ORAL | 3 refills | Status: AC

## 2024-04-21 MED ORDER — ROSUVASTATIN CALCIUM 40 MG PO TABS: 40.0000 mg | ORAL_TABLET | Freq: Every day | ORAL | 3 refills | Status: AC

## 2024-04-21 MED ORDER — SILDENAFIL CITRATE 50 MG PO TABS: 50.0000 mg | ORAL_TABLET | Freq: Every day | ORAL | 3 refills | Status: AC | PRN

## 2024-04-21 MED ORDER — ASPIRIN 81 MG PO TBEC: 81.0000 mg | DELAYED_RELEASE_TABLET | Freq: Every day | ORAL | Status: AC

## 2024-04-21 NOTE — Patient Instructions (Signed)
 Medication Instructions:  Your physician recommends that you continue on your current medications as directed. Please refer to the Current Medication list given to you today.  *If you need a refill on your cardiac medications before your next appointment, please call your pharmacy*  Lab Work: TODAY:  CMET & LIPID  If you have labs (blood work) drawn today and your tests are completely normal, you will receive your results only by: MyChart Message (if you have MyChart) OR A paper copy in the mail If you have any lab test that is abnormal or we need to change your treatment, we will call you to review the results.  Testing/Procedures: None ordered  Follow-Up: At [facility], you and your health needs are our priority.  As part of our continuing mission to provide you with exceptional heart care, our providers are all part of one team.  This team includes your primary Cardiologist (physician) and Advanced Practice Providers or APPs (Physician Assistants and Nurse Practitioners) who all work together to provide you with the care you need, when you need it.  Your next appointment:   1 year(s)  Provider:   Ozell Fell, MD or Glendia Ferrier, PA-C          We recommend signing up for the patient portal called MyChart.  Sign up information is provided on this After Visit Summary.  MyChart is used to connect with patients for Virtual Visits (Telemedicine).  Patients are able to view lab/test results, encounter notes, upcoming appointments, etc.  Non-urgent messages can be sent to your provider as well.   To learn more about what you can do with MyChart, go to [email or web address].   Other Instructions

## 2024-04-22 ENCOUNTER — Ambulatory Visit: Payer: Self-pay | Admitting: Emergency Medicine

## 2024-04-28 NOTE — Telephone Encounter (Signed)
 Patient returned RN's call regarding results.

## 2024-08-11 ENCOUNTER — Ambulatory Visit

## 2024-08-11 VITALS — BP 120/82 | HR 66 | Temp 98.0°F | Ht 73.25 in | Wt 209.0 lb

## 2024-08-11 DIAGNOSIS — E785 Hyperlipidemia, unspecified: Secondary | ICD-10-CM

## 2024-08-11 DIAGNOSIS — Z7984 Long term (current) use of oral hypoglycemic drugs: Secondary | ICD-10-CM

## 2024-08-11 DIAGNOSIS — D751 Secondary polycythemia: Secondary | ICD-10-CM

## 2024-08-11 DIAGNOSIS — E1159 Type 2 diabetes mellitus with other circulatory complications: Secondary | ICD-10-CM

## 2024-08-11 LAB — POCT GLYCOSYLATED HEMOGLOBIN (HGB A1C): Hemoglobin A1C: 9.2 % — AB (ref 4.0–5.6)

## 2024-08-11 LAB — HM DIABETES FOOT EXAM
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
## 2024-08-11 NOTE — Patient Instructions (Signed)
 Thank you for visiting [facility] today! Here's what we talked about: - START Taking carvedilol  twice daily - START taking two tablets of the glipizide 

## 2024-08-12 ENCOUNTER — Ambulatory Visit: Payer: Self-pay

## 2024-09-29 ENCOUNTER — Telehealth: Payer: Self-pay

## 2024-09-29 NOTE — Telephone Encounter (Signed)
 Copied from CRM [id number]. Topic: Clinical - Medication Refill >> Sep 29, 2024  1:45 PM Viola F wrote: Medication:  Needs new script for the GlipiZIDE  (GLUCOTROL ) 5 MG tablet - Dr. Bennett changed from taking 1 a day to 2 a day   MetFORMIN  (GLUCOPHAGE ) 500 MG tablet [[id number]]  Has the patient contacted their pharmacy? Yes (Agent: If no, request that the patient contact the pharmacy for the refill. If patient does not wish to contact the pharmacy document the reason why and proceed with request.) (Agent: If yes, when and what did the pharmacy advise?)  This is the patient's preferred pharmacy:   [facility] [address] ([address] [place] - [address] [place] ([place]) [place] [address] Phone: [phone] Fax: [phone]  Is this the correct pharmacy for this prescription? Yes If no, delete pharmacy and type the correct one.   Has the prescription been filled recently? Yes  Is the patient out of the medication? No, 3 days left.   Has the patient been seen for an appointment in the last year OR does the patient have an upcoming appointment? Yes  Can we respond through MyChart? No  Agent: Please be advised that Rx refills may take up to 3 business days. We ask that you follow-up with your pharmacy.

## 2025-02-09 ENCOUNTER — Encounter: Admitting: Nurse Practitioner
